# Patient Record
Sex: Female | Born: 1945 | Race: Black or African American | Hispanic: No | Smoking: Former smoker
Health system: Southern US, Community
[De-identification: ages and names within clinical notes are randomized; demographics above are authoritative.]

## PROBLEM LIST (undated history)

## (undated) DIAGNOSIS — I5042 Chronic combined systolic (congestive) and diastolic (congestive) heart failure: Secondary | ICD-10-CM

## (undated) DIAGNOSIS — M1990 Unspecified osteoarthritis, unspecified site: Secondary | ICD-10-CM

## (undated) DIAGNOSIS — D649 Anemia, unspecified: Secondary | ICD-10-CM

## (undated) DIAGNOSIS — R011 Cardiac murmur, unspecified: Secondary | ICD-10-CM

## (undated) DIAGNOSIS — R519 Headache, unspecified: Secondary | ICD-10-CM

## (undated) DIAGNOSIS — J189 Pneumonia, unspecified organism: Secondary | ICD-10-CM

## (undated) DIAGNOSIS — I447 Left bundle-branch block, unspecified: Secondary | ICD-10-CM

## (undated) DIAGNOSIS — I509 Heart failure, unspecified: Secondary | ICD-10-CM

## (undated) DIAGNOSIS — C801 Malignant (primary) neoplasm, unspecified: Secondary | ICD-10-CM

## (undated) DIAGNOSIS — G4730 Sleep apnea, unspecified: Secondary | ICD-10-CM

## (undated) DIAGNOSIS — K219 Gastro-esophageal reflux disease without esophagitis: Secondary | ICD-10-CM

## (undated) DIAGNOSIS — E662 Morbid (severe) obesity with alveolar hypoventilation: Secondary | ICD-10-CM

## (undated) DIAGNOSIS — R51 Headache: Secondary | ICD-10-CM

## (undated) DIAGNOSIS — I420 Dilated cardiomyopathy: Secondary | ICD-10-CM

## (undated) DIAGNOSIS — M109 Gout, unspecified: Secondary | ICD-10-CM

## (undated) DIAGNOSIS — R600 Localized edema: Secondary | ICD-10-CM

## (undated) DIAGNOSIS — J449 Chronic obstructive pulmonary disease, unspecified: Secondary | ICD-10-CM

## (undated) DIAGNOSIS — I10 Essential (primary) hypertension: Secondary | ICD-10-CM

## (undated) HISTORY — DX: Gastro-esophageal reflux disease without esophagitis: K21.9

## (undated) HISTORY — DX: Dilated cardiomyopathy: I42.0

## (undated) HISTORY — PX: TUBAL LIGATION: SHX77

## (undated) HISTORY — DX: Gout, unspecified: M10.9

## (undated) HISTORY — DX: Malignant (primary) neoplasm, unspecified: C80.1

## (undated) HISTORY — DX: Left bundle-branch block, unspecified: I44.7

## (undated) HISTORY — DX: Chronic combined systolic (congestive) and diastolic (congestive) heart failure: I50.42

## (undated) HISTORY — DX: Morbid (severe) obesity with alveolar hypoventilation: E66.2

## (undated) HISTORY — DX: Localized edema: R60.0

---

## 1977-07-02 HISTORY — PX: PARTIAL HYSTERECTOMY: SHX80

## 2000-06-05 ENCOUNTER — Ambulatory Visit (HOSPITAL_COMMUNITY): Admission: RE | Admit: 2000-06-05 | Discharge: 2000-06-05 | Payer: Self-pay | Admitting: *Deleted

## 2000-06-07 ENCOUNTER — Encounter: Admission: RE | Admit: 2000-06-07 | Discharge: 2000-06-07 | Payer: Self-pay | Admitting: *Deleted

## 2000-08-03 ENCOUNTER — Emergency Department (HOSPITAL_COMMUNITY): Admission: EM | Admit: 2000-08-03 | Discharge: 2000-08-03 | Payer: Self-pay | Admitting: Internal Medicine

## 2002-02-27 ENCOUNTER — Encounter: Admission: RE | Admit: 2002-02-27 | Discharge: 2002-02-27 | Payer: Self-pay | Admitting: Cardiology

## 2002-02-27 ENCOUNTER — Encounter: Payer: Self-pay | Admitting: Cardiology

## 2002-03-05 ENCOUNTER — Encounter: Payer: Self-pay | Admitting: Cardiology

## 2002-03-05 ENCOUNTER — Encounter: Admission: RE | Admit: 2002-03-05 | Discharge: 2002-03-05 | Payer: Self-pay | Admitting: Cardiology

## 2002-04-01 ENCOUNTER — Encounter: Admission: RE | Admit: 2002-04-01 | Discharge: 2002-04-01 | Payer: Self-pay | Admitting: Cardiology

## 2002-04-01 ENCOUNTER — Encounter: Payer: Self-pay | Admitting: Cardiology

## 2002-05-04 ENCOUNTER — Ambulatory Visit (HOSPITAL_BASED_OUTPATIENT_CLINIC_OR_DEPARTMENT_OTHER): Admission: RE | Admit: 2002-05-04 | Discharge: 2002-05-04 | Payer: Self-pay | Admitting: Cardiology

## 2002-05-28 ENCOUNTER — Emergency Department (HOSPITAL_COMMUNITY): Admission: EM | Admit: 2002-05-28 | Discharge: 2002-05-29 | Payer: Self-pay | Admitting: Emergency Medicine

## 2002-05-29 ENCOUNTER — Encounter: Payer: Self-pay | Admitting: Emergency Medicine

## 2002-09-05 ENCOUNTER — Encounter: Payer: Self-pay | Admitting: Emergency Medicine

## 2002-09-05 ENCOUNTER — Emergency Department (HOSPITAL_COMMUNITY): Admission: EM | Admit: 2002-09-05 | Discharge: 2002-09-05 | Payer: Self-pay | Admitting: Emergency Medicine

## 2004-07-24 ENCOUNTER — Emergency Department (HOSPITAL_COMMUNITY): Admission: EM | Admit: 2004-07-24 | Discharge: 2004-07-24 | Payer: Self-pay | Admitting: Emergency Medicine

## 2005-09-12 ENCOUNTER — Emergency Department (HOSPITAL_COMMUNITY): Admission: EM | Admit: 2005-09-12 | Discharge: 2005-09-12 | Payer: Self-pay | Admitting: Family Medicine

## 2008-01-21 ENCOUNTER — Encounter: Admission: RE | Admit: 2008-01-21 | Discharge: 2008-01-21 | Payer: Self-pay | Admitting: Nephrology

## 2008-11-16 ENCOUNTER — Encounter (HOSPITAL_BASED_OUTPATIENT_CLINIC_OR_DEPARTMENT_OTHER): Admission: RE | Admit: 2008-11-16 | Discharge: 2008-12-03 | Payer: Self-pay | Admitting: Internal Medicine

## 2009-07-18 ENCOUNTER — Encounter (HOSPITAL_BASED_OUTPATIENT_CLINIC_OR_DEPARTMENT_OTHER): Admission: RE | Admit: 2009-07-18 | Discharge: 2009-10-16 | Payer: Self-pay | Admitting: General Surgery

## 2009-08-01 ENCOUNTER — Encounter (HOSPITAL_BASED_OUTPATIENT_CLINIC_OR_DEPARTMENT_OTHER): Payer: Self-pay | Admitting: General Surgery

## 2009-08-01 ENCOUNTER — Ambulatory Visit: Payer: Self-pay | Admitting: Vascular Surgery

## 2009-08-01 ENCOUNTER — Ambulatory Visit (HOSPITAL_COMMUNITY): Admission: RE | Admit: 2009-08-01 | Discharge: 2009-08-01 | Payer: Self-pay | Admitting: General Surgery

## 2009-10-18 ENCOUNTER — Encounter (HOSPITAL_BASED_OUTPATIENT_CLINIC_OR_DEPARTMENT_OTHER): Admission: RE | Admit: 2009-10-18 | Discharge: 2010-01-16 | Payer: Self-pay | Admitting: General Surgery

## 2010-11-14 NOTE — Assessment & Plan Note (Signed)
[facility]   NAME:  Melody Flores, Melody Flores NO.:  [id number]   MEDICAL RECORD NO.:  [id number]      DATE OF BIRTH:  1945/11/30   PHYSICIAN:  Maxwell Caul, M.D. VISIT DATE:  11/18/2008                                   OFFICE VISIT   Melody Flores is a 65 year old woman, who is referred from Dr. Janey Flores  office for review an area on her left anterior leg.  She tells me that  this started about 8 months ago with a blister that formed on the leg.  It never really opened, but it became raised, hyperkeratotic, somewhat  warm and painful.  Since then, she has had similar areas developed, and  she has several of these raised, hyperkeratotic areas on the left  anterior leg.  She has a history of morbid obesity and probably some  degree of chronic lymphedema of the left greater than right leg, but she  has never had anything like this.  She does not have any other areas on  her body and has no other wound issues.   PAST MEDICAL HISTORY:  1. Peripheral vascular disease.  2. Cervical cancer status post hysterectomy.  3. Congestive heart failure.  4. Heart attack in 2007.  5. Gout.  6. Gastroesophageal reflux disease.  7. Hypertension.   MEDICATION LIST:  Reviewed.  She is on albuterol, ibuprofen 800 p.o.  p.r.n.  She takes 20 of furosemide, tramadol 500 b.i.d., omeprazole 20  daily, valsartan 320 daily, amlodipine 5 daily, ASA 81 daily, Lyrica 25  daily, and colchicine 0.6 b.i.d.   ALLERGIES:  1. PENICILLIN.  2. SULFA.   PHYSICAL EXAMINATION:  GENERAL:  She is a morbidly obese woman.  CARDIAC:  No evidence of congestive heart failure.  EXTREMITIES:  Her left leg is much more swollen than the right.  Yet  there is no general warmth, there is minimal pitting, fitting most with  probably chronic lymphedema and venous stasis.  Her pulse was palpable  at the dorsalis pedis.  We went ahead and did ABIs, which show with an  ABI of 1.36 on the right  and 1.24 on the left.  Capillary refill was  less than 3 seconds.  SKIN:  On the anterior part of her left leg, just above her ankle, is a  patch of what looked to be some degree of venous stasis; however, there  were several raised areas, which were hyperkeratotic, they were not  vascular, somewhat tender.  The largest of the measured roughly 2 x 2 cm  and was at least 0.5 cm in height.  I went ahead and did a punch biopsy  of this.  I really do not have a clear understanding of what this is.  It is not overtly infected and is not vascular, although it certainly  looks as though she has some degree of venous stasis and lymphedema.   IMPRESSIONS:  Hyperkeratotic areas on the left leg.  These are not  clearly wounds and are not blisters.  I did biopsy of largest of these,  applied trichloroacetic acid and wrapped these in a Profore to see if we  could control the  swelling and what effect this might have on this area.  We will look to  the biopsy for guidance here.  The fact that there are multiple areas  here makes me think that this was probably not a carcinoma.  Feeling  that, we would seek dermatology consultation.           ______________________________  Maxwell Caul, M.D.     MGR/MEDQ  D:  11/18/2008  T:  11/19/2008  Job:  [id number]

## 2010-11-14 NOTE — Assessment & Plan Note (Signed)
[facility]   NAME:  Melody Flores, Melody Flores NO.:  [id number]   MEDICAL RECORD NO.:  [id number]      DATE OF BIRTH:  10/11/1945   PHYSICIAN:  Maxwell Caul, M.D. VISIT DATE:  11/25/2008                                   OFFICE VISIT   HISTORY:  Melody Flores is a lady we saw last week referred from Dr.  Ardelle Balls office for review an area on her left anterior leg.  She had  had several raised hyperkeratotic areas on the anterior aspect of her  left leg which started about 8 months ago.  I actually went ahead and  biopsied this.  Unfortunately, the list of possibilities is large.  The  microscopy showed epidermal hyperplasia with slight atypia.  A  differential included verruca, Lichen simplex chronicus, seborrheic  keratosis, or precancerous keratosis such as actinic keratosis.  The  patient does not have an open wound.  Last week I put some steroid in  this and wrapped her to control her significant lymphedema of the left  leg wondering how much this had to do with this, however, she returns  today really with the leg looking not much different from last week.   PHYSICAL EXAMINATION:  She has temperature of 98.7.  she has at least 4  hyperkeratotic lesions within an area that looks somewhat like venous  stasis.  None of these is particularly tender.  However, there is no  erythema and no warmth.   IMPRESSION:  Hyperkeratotic skin lesions which have been going on for  roughly 8-9 months.  The biopsy report is nondiagnostic.  We will fax  the dermatopathology to Dr. Joseph Art and we have arranged an appointment  for her next week in dermatologic consultation.           ______________________________  Maxwell Caul, M.D.     MGR/MEDQ  D:  11/25/2008  T:  11/26/2008  Job:  [id number]

## 2011-08-16 ENCOUNTER — Emergency Department (HOSPITAL_COMMUNITY): Payer: Medicare (Managed Care)

## 2011-08-16 ENCOUNTER — Encounter (HOSPITAL_COMMUNITY): Payer: Self-pay | Admitting: Emergency Medicine

## 2011-08-16 ENCOUNTER — Emergency Department (HOSPITAL_COMMUNITY)
Admission: EM | Admit: 2011-08-16 | Discharge: 2011-08-16 | Disposition: A | Discharge status: Home / Self Care | Payer: Medicare (Managed Care) | Attending: Emergency Medicine | Admitting: Emergency Medicine

## 2011-08-16 DIAGNOSIS — A599 Trichomoniasis, unspecified: Secondary | ICD-10-CM | POA: Insufficient documentation

## 2011-08-16 DIAGNOSIS — M51379 Other intervertebral disc degeneration, lumbosacral region without mention of lumbar back pain or lower extremity pain: Secondary | ICD-10-CM | POA: Insufficient documentation

## 2011-08-16 DIAGNOSIS — M5137 Other intervertebral disc degeneration, lumbosacral region: Secondary | ICD-10-CM | POA: Insufficient documentation

## 2011-08-16 DIAGNOSIS — I10 Essential (primary) hypertension: Secondary | ICD-10-CM | POA: Insufficient documentation

## 2011-08-16 DIAGNOSIS — N281 Cyst of kidney, acquired: Secondary | ICD-10-CM

## 2011-08-16 DIAGNOSIS — R109 Unspecified abdominal pain: Secondary | ICD-10-CM | POA: Insufficient documentation

## 2011-08-16 DIAGNOSIS — Q619 Cystic kidney disease, unspecified: Secondary | ICD-10-CM | POA: Insufficient documentation

## 2011-08-16 DIAGNOSIS — N390 Urinary tract infection, site not specified: Secondary | ICD-10-CM | POA: Insufficient documentation

## 2011-08-16 HISTORY — DX: Essential (primary) hypertension: I10

## 2011-08-16 HISTORY — DX: Heart failure, unspecified: I50.9

## 2011-08-16 LAB — POCT I-STAT, CHEM 8
Calcium, Ion: 1.12 mmol/L (ref 1.12–1.32)
Chloride: 104 mEq/L (ref 96–112)
Creatinine, Ser: 1.2 mg/dL — ABNORMAL HIGH (ref 0.50–1.10)
Glucose, Bld: 110 mg/dL — ABNORMAL HIGH (ref 70–99)
HCT: 37 % (ref 36.0–46.0)
Potassium: 4 mEq/L (ref 3.5–5.1)

## 2011-08-16 LAB — URINE MICROSCOPIC-ADD ON

## 2011-08-16 LAB — URINALYSIS, ROUTINE W REFLEX MICROSCOPIC
Glucose, UA: NEGATIVE mg/dL
Protein, ur: NEGATIVE mg/dL
Specific Gravity, Urine: 1.021 (ref 1.005–1.030)
Urobilinogen, UA: 1 mg/dL (ref 0.0–1.0)

## 2011-08-16 LAB — CBC
HCT: 35.9 % — ABNORMAL LOW (ref 36.0–46.0)
Hemoglobin: 11.8 g/dL — ABNORMAL LOW (ref 12.0–15.0)
WBC: 11.1 10*3/uL — ABNORMAL HIGH (ref 4.0–10.5)

## 2011-08-16 MED ORDER — CIPROFLOXACIN HCL 500 MG PO TABS
500.0000 mg | ORAL_TABLET | Freq: Once | ORAL | Status: AC
  Administered 2011-08-16: 500 mg via ORAL
  Filled 2011-08-16: qty 1

## 2011-08-16 MED ORDER — KETOROLAC TROMETHAMINE 60 MG/2ML IM SOLN
60.0000 mg | Freq: Once | INTRAMUSCULAR | Status: AC
  Administered 2011-08-16: 60 mg via INTRAMUSCULAR
  Filled 2011-08-16: qty 2

## 2011-08-16 MED ORDER — METRONIDAZOLE 500 MG PO TABS
2000.0000 mg | ORAL_TABLET | Freq: Once | ORAL | Status: AC
  Administered 2011-08-16: 2000 mg via ORAL
  Filled 2011-08-16: qty 4

## 2011-08-16 MED ORDER — CIPROFLOXACIN HCL 500 MG PO TABS: 500.0000 mg | ORAL_TABLET | Freq: Two times a day (BID) | ORAL | Status: AC

## 2011-08-16 MED ORDER — TRAMADOL HCL 50 MG PO TABS: 50.0000 mg | ORAL_TABLET | Freq: Four times a day (QID) | ORAL | Status: AC | PRN

## 2011-08-16 NOTE — ED Provider Notes (Signed)
History     CSN: [id number]  Arrival date & time 08/16/11  0122   First MD Initiated Contact with Patient 08/16/11 0151      Chief Complaint  Patient presents with  . Flank Pain    (Consider location/radiation/quality/duration/timing/severity/associated sxs/prior treatment) Patient is a 66 y.o. female presenting with flank pain. The history is provided by the patient. No language interpreter was used.  Flank Pain This is a new problem. The current episode started more than 1 week ago (1 months ago). The problem occurs constantly. The problem has not changed since onset.Associated symptoms include abdominal pain. Pertinent negatives include no chest pain, no headaches and no shortness of breath. The symptoms are aggravated by bending, walking and twisting. The symptoms are relieved by nothing. She has tried acetaminophen for the symptoms. The treatment provided no relief.  Has had dysuria.  No f/c/r. No CP, SOB n/v/d.  One episode of diarrhea yesterday  Past Medical History  Diagnosis Date  . Gout   . Obesities, morbid   . Hypertension   . CHF (congestive heart failure)     History reviewed. No pertinent past surgical history.  No family history on file.  History  Substance Use Topics  . Smoking status: Never Smoker   . Smokeless tobacco: Not on file  . Alcohol Use: No    OB History    Grav Para Term Preterm Abortions TAB SAB Ect Mult Living                  Review of Systems  Constitutional: Negative.   HENT: Negative.   Eyes: Negative.   Respiratory: Negative for shortness of breath.   Cardiovascular: Negative for chest pain.  Gastrointestinal: Positive for abdominal pain.  Genitourinary: Positive for dysuria, urgency and flank pain.  Musculoskeletal: Negative.   Neurological: Negative for headaches.  Hematological: Negative.   Psychiatric/Behavioral: Negative.     Allergies  Penicillins and Sulfa antibiotics  Home Medications  No current outpatient  prescriptions on file.  BP 158/100  Pulse 114  Temp(Src) 97.9 F (36.6 C) (Oral)  Resp 22  Wt 330 lb (149.687 kg)  SpO2 90%  Physical Exam  Constitutional: She is oriented to person, place, and time. She appears well-developed and well-nourished. No distress.  HENT:  Head: Normocephalic and atraumatic.  Mouth/Throat: Oropharynx is clear and moist. No oropharyngeal exudate.  Eyes: Conjunctivae are normal. Pupils are equal, round, and reactive to light.  Neck: Normal range of motion. Neck supple.  Cardiovascular: Normal rate and regular rhythm.   Pulmonary/Chest: Effort normal and breath sounds normal. She has no wheezes. She has no rales.  Abdominal: Soft. Bowel sounds are normal. She exhibits no mass. There is no rebound and no guarding.  Musculoskeletal: Normal range of motion. She exhibits no edema and no tenderness.  Neurological: She is alert and oriented to person, place, and time.  Skin: Skin is warm and dry. She is not diaphoretic.  Psychiatric: She has a normal mood and affect.    ED Course  Procedures (including critical care time)   Labs Reviewed  CBC  URINALYSIS, ROUTINE W REFLEX MICROSCOPIC  URINE CULTURE   No results found.   No diagnosis found.    MDM  Please inform your doctor of renal cysts.  No sexual acitivity until 7 days after all partners treated        Melody Flores Melody Cords, MD 08/16/11 [phone]

## 2011-08-16 NOTE — Discharge Instructions (Signed)

## 2011-08-16 NOTE — ED Notes (Signed)
Pt alert, nad, c/o right flank pain, onset 1 month ago, denies recent injury, denies changes in bowel or bladder habits, denies nausea or emesis, states increases with movement

## 2011-08-16 NOTE — ED Notes (Signed)
Pt complaining of R side pain that radiates into her back, st's she only feels pain when she moves.  Pain started around 1 month ago.  No n/v/d, no SOB, no other complaints.  Bowel sound normoactive in all quadrants, breath sounds clear and equal bilaterally.

## 2011-08-17 LAB — URINE CULTURE: Colony Count: >=100000

## 2012-01-25 ENCOUNTER — Emergency Department (HOSPITAL_COMMUNITY)
Admission: EM | Admit: 2012-01-25 | Discharge: 2012-01-25 | Disposition: A | Discharge status: Home / Self Care | Payer: Medicare Other | Attending: Emergency Medicine | Admitting: Emergency Medicine

## 2012-01-25 ENCOUNTER — Encounter (HOSPITAL_COMMUNITY): Payer: Self-pay | Admitting: Emergency Medicine

## 2012-01-25 ENCOUNTER — Emergency Department (HOSPITAL_COMMUNITY): Payer: Medicare Other

## 2012-01-25 DIAGNOSIS — J449 Chronic obstructive pulmonary disease, unspecified: Secondary | ICD-10-CM | POA: Insufficient documentation

## 2012-01-25 DIAGNOSIS — I10 Essential (primary) hypertension: Secondary | ICD-10-CM | POA: Insufficient documentation

## 2012-01-25 DIAGNOSIS — M109 Gout, unspecified: Secondary | ICD-10-CM | POA: Insufficient documentation

## 2012-01-25 DIAGNOSIS — J4489 Other specified chronic obstructive pulmonary disease: Secondary | ICD-10-CM | POA: Insufficient documentation

## 2012-01-25 DIAGNOSIS — T5991XA Toxic effect of unspecified gases, fumes and vapors, accidental (unintentional), initial encounter: Secondary | ICD-10-CM

## 2012-01-25 DIAGNOSIS — Z7982 Long term (current) use of aspirin: Secondary | ICD-10-CM | POA: Insufficient documentation

## 2012-01-25 DIAGNOSIS — Z79899 Other long term (current) drug therapy: Secondary | ICD-10-CM | POA: Insufficient documentation

## 2012-01-25 HISTORY — DX: Pneumonia, unspecified organism: J18.9

## 2012-01-25 HISTORY — DX: Chronic obstructive pulmonary disease, unspecified: J44.9

## 2012-01-25 MED ORDER — IPRATROPIUM BROMIDE 0.02 % IN SOLN
0.5000 mg | Freq: Once | RESPIRATORY_TRACT | Status: AC
  Administered 2012-01-25: 0.5 mg via RESPIRATORY_TRACT
  Filled 2012-01-25: qty 2.5

## 2012-01-25 MED ORDER — ALBUTEROL SULFATE (5 MG/ML) 0.5% IN NEBU
2.5000 mg | INHALATION_SOLUTION | Freq: Once | RESPIRATORY_TRACT | Status: AC
  Administered 2012-01-25: 2.5 mg via RESPIRATORY_TRACT
  Filled 2012-01-25: qty 0.5

## 2012-01-25 NOTE — ED Provider Notes (Signed)
History     CSN: [id number]  Arrival date & time 01/25/12  1911   First MD Initiated Contact with Patient 01/25/12 2000      Chief Complaint  Patient presents with  . Toxic Inhalation    (Consider location/radiation/quality/duration/timing/severity/associated sxs/prior treatment) HPI Comments: Patient states, that her elderly mother was cleaning "bed bugs."  In her room early this morning.  Patient noticed the smell of kerosene and had a burning sensation in her throat.  She immediately left the house, but has had persistent feeling of burning.  She did use her albuterol inhaler with relief.     She denies nausea, dizziness, shortness of breath, chest pain. She has not repeated the use of her inhaler isince, stating, that her nebulizer machine, is in the house, and she was afraid to overuse the inhaler.   She will not be sleeping in the house until the odor is common.  Her spouse who is with her says the windows have been opened and the attempted cleaning.   The history is provided by the patient.    Past Medical History  Diagnosis Date  . Gout   . Obesities, morbid   . Hypertension   . CHF (congestive heart failure)   . COPD (chronic obstructive pulmonary disease)   . Pneumonia     Past Surgical History  Procedure Date  . Tubal ligation   . Partial hysterectomy     Family History  Problem Relation Age of Onset  . Hypertension Other   . Diabetes Other   . Asthma Other     History  Substance Use Topics  . Smoking status: Never Smoker   . Smokeless tobacco: Not on file  . Alcohol Use: No    OB History    Grav Para Term Preterm Abortions TAB SAB Ect Mult Living                  Review of Systems  Constitutional: Negative for fever and chills.  HENT: Positive for sore throat. Negative for postnasal drip.   Respiratory: Negative for cough and shortness of breath.   Neurological: Negative for dizziness, light-headedness and headaches.    Allergies    Penicillins and Sulfa antibiotics  Home Medications   Current Outpatient Rx  Name Route Sig Dispense Refill  . ALLOPURINOL 100 MG PO TABS Oral Take 100 mg by mouth daily.    . ASPIRIN EC 81 MG PO TBEC Oral Take 81 mg by mouth daily.    . FUROSEMIDE 40 MG PO TABS Oral Take 40 mg by mouth daily.    . IBUPROFEN 800 MG PO TABS Oral Take 800 mg by mouth 2 (two) times daily.    . ADULT MULTIVITAMIN W/MINERALS CH Oral Take 1 tablet by mouth daily.    Marland Kitchen OMEPRAZOLE 20 MG PO CPDR Oral Take 20 mg by mouth daily.    Marland Kitchen POTASSIUM CHLORIDE CRYS ER 20 MEQ PO TBCR Oral Take 20 mEq by mouth 2 (two) times daily.    . TRAMADOL HCL 50 MG PO TABS Oral Take 50 mg by mouth every 6 (six) hours as needed. For pain    . VALSARTAN 160 MG PO TABS Oral Take 160 mg by mouth daily.      BP 160/59  Pulse 93  Temp 98.7 F (37.1 C) (Oral)  Resp 18  SpO2 100%  Physical Exam  Constitutional: She is oriented to person, place, and time. She appears well-nourished.  Morbidly obese  HENT:  Head: Normocephalic.  Mouth/Throat: Uvula is midline. No uvula swelling. No posterior oropharyngeal edema or posterior oropharyngeal erythema.  Eyes: Pupils are equal, round, and reactive to light.  Neck: Normal range of motion.  Cardiovascular: Normal rate.   Pulmonary/Chest: Effort normal.  Neurological: She is alert and oriented to person, place, and time.  Skin: Skin is warm. No rash noted. No erythema.    ED Course  Procedures (including critical care time)  Labs Reviewed - No data to display No results found.   No diagnosis found.    MDM   Will administer albuterol/Atrovent treatment, obtain chest x-ray, and reevaluate Feels better        Arman Filter, NP 01/25/12 2208

## 2012-01-25 NOTE — ED Notes (Signed)
Pt states her mother dropped some kerosene in the house and she inhaled some of the fumes  Pt states she has COPD  Pt states this evening she started having some burning in her throat  Pt states she has been outside all day  Pt states she had to use her inhaler earlier today and it helped some

## 2012-01-25 NOTE — ED Notes (Signed)
Pt c/o throat pain that began earlier today after she was exposed to kerosene fumes. Pt states her sister dropped a kerosene tank causing the fumes to leak. Pt has hx of COPD but states she is not currently SOB. Pt's only complaint is burning in the throat.

## 2012-01-26 NOTE — ED Provider Notes (Signed)
Medical screening examination/treatment/procedure(s) were performed by non-physician practitioner and as supervising physician I was immediately available for consultation/collaboration.  Derwood Kaplan, MD 01/26/12 1451

## 2014-07-14 ENCOUNTER — Other Ambulatory Visit (HOSPITAL_COMMUNITY): Payer: Self-pay | Admitting: Internal Medicine

## 2014-07-14 DIAGNOSIS — I509 Heart failure, unspecified: Secondary | ICD-10-CM

## 2014-07-16 ENCOUNTER — Ambulatory Visit (HOSPITAL_COMMUNITY)
Admission: RE | Admit: 2014-07-16 | Discharge: 2014-07-16 | Disposition: A | Discharge status: Home / Self Care | Payer: Commercial Managed Care - HMO | Source: Ambulatory Visit | Attending: Internal Medicine | Admitting: Internal Medicine

## 2014-07-16 DIAGNOSIS — I509 Heart failure, unspecified: Secondary | ICD-10-CM | POA: Diagnosis not present

## 2014-07-16 NOTE — Progress Notes (Signed)
  Echocardiogram 2D Echocardiogram has been performed.  Melody Flores 07/16/2014, 5:32 PM

## 2014-12-12 DIAGNOSIS — R0609 Other forms of dyspnea: Secondary | ICD-10-CM

## 2014-12-12 DIAGNOSIS — I420 Dilated cardiomyopathy: Secondary | ICD-10-CM

## 2014-12-12 NOTE — H&P (Signed)
OFFICE VISIT NOTES COPIED TO EPIC FOR DOCUMENTATION   Melody Flores 2014-12-06 11:45 AM Location: [facility] Patient #: [id number] DOB: July 27, 1945 Widowed / Language: Melody Flores / Race: Black or African American Female    History of Present Illness(Melody Flores, AGNP-C; 12-06-2014 12:27 PM) The patient is a 69 year old female who presents with congestive heart failure. Symptoms include shortness of breath, dyspnea on exertion and edema, while symptoms do not include orthopnea or paroxysmal nocturnal dyspnea. Onset was gradual. The symptoms occur constantly. The patient describes this as mild and unchanged. Associated symptoms include wheezing, while associated symptoms do not include chest pain or palpitations. Current treatment includes diuretics and angiotensin receptor blockers. By report there is good compliance with treatment, good tolerance of treatment and fair symptom control. Pertinent medical history includes hypertension and chronic obstructive pulmonary disease. Risk factors include obesity and sedentary lifestyle, while risk factors do not include smoking. The patient is able to do activities of daily living with restrictions. The patient was previously evaluated by a primary physician 5 month(s) ago. Past evaluation has included an ECG and an echocardiogram.     Problem List/Past Medical(Melody Flores; 12/06/2014 11:46 AM) CHF (congestive heart failure) (I50.9) Gouty arthritis (M10.9) Essential hypertension, benign (I10) COPD (chronic obstructive pulmonary disease) (J44.9) GERD (gastroesophageal reflux disease) (K21.9)    Allergies(Melody Flores; 06-Dec-2014 11:49 AM) Penicillins. Swelling. Sulfa Drugs. Swelling.    Family History(Melody Flores; December 06, 2014 11:51 AM) Father. Deceased. at age 13 from natural causes. Mother. Deceased. at age 56 from pneumonia; hx of htn Sister 27. 3 Deceased 1 Living (younger) Brother 3. 2 Deceased 1  Living (younger)    Social History(Melody Flores; Dec 06, 2014 11:52 AM) Number of Children. 67. 3 sons- all alive and well and 2 daughters alive and well and 1 daughter deceased at age 77 due to colon cancer. Current tobacco use. Never smoker. Non Drinker/No Alcohol Use Marital status. Widowed. Living Situation. Lives with relatives. Grandaughter lives with her    Past Surgical History(Melody Flores; 12/06/14 11:52 AM) Hysterectomy; Abdominal. 1979 Due to Cancer    Medication History(Melody Flores; 2014-12-06 11:57 AM) Medications Reconciled. Omeprazole (20MG  Tablet DR, 1 Oral daily) Active. Potassium Chloride (20MEQ Tablet ER, 1 Oral daily) Active. Valsartan (320MG  Tablet, 1 Oral daily) Active. Aspirin (81MG  Tablet, 1 Oral daily) Active. Furosemide (40MG  Tablet, 1 Oral daily) Active. Allopurinol (100MG  Tablet, 1 Oral daily) Active. Diclofenac Sodium (75MG  Tablet, 1 Oral two times daily as needed) Active. TraMADol HCl (50MG  Tablet, 1 Oral two times daily as needed) Active. Multiple Vitamin (1 (one) Oral daily) Active. Proventil HFA (108 (90 Base)MCG/ACT Aerosol Soln, 2 puffs Inhalation as needed) Active. Albuterol Sulfate ((5 MG/ML)0.5% Nebulized Soln, inhale Inhalation three times daily) Active.    Diagnostic Studies History(Melody Flores, AGNP-C; Dec 06, 2014 4:38 PM) Echocardiogram. 07/16/2014 Left ventricle: The cavity size was normal. Wall thickness was normal. The estimated ejection fraction was in the range of 20% to 25%. Mitral valve: There was mild regurgitation. Left atrium: The atrium was moderately dilated Sleep Study. 2006 Positive for Sleep Apnea, Pt uses CPAP Nuclear stress test. 2008    Review of Systems(Melody Flores, AGNP-C; 12-06-14 12:27 PM) General:Not Present- Anorexia, Fatigue and Fever. Respiratory:Present- Difficulty Breathing on Exertionand Wheezing. Not Present- Cough and Decreased Exercise Tolerance. Cardiovascular:Not  Present- Chest Pain, Claudications, Edema, Orthopnea, Paroxysmal Nocturnal Dyspnea and Shortness of Breath. Gastrointestinal:Not Present- Change in Bowel Habits, Constipation and Nausea. Neurological:Not Present- Focal Neurological Symptoms. Endocrine:Not Present- Appetite Changes, Cold Intolerance and Heat Intolerance. Hematology:Not Present- Anemia, Petechiae  and Prolonged Bleeding.    Vitals(Melody Flores, AGNP-C; 12/01/2014 12:19 PM) 12/01/2014 12:19 PM BP: 190/110 (Sitting, Left Arm, Standard)  12/01/2014 11:46 AM Weight: 357.56 lb Height: 66 in Body Surface Area: 2.75 m Body Mass Index: 57.71 kg/m Pulse: 107 (Regular) P.OX: 94% (Room air) BP: 198/138 (Sitting, Left Arm, Standard)     Physical Exam(Melody Flores, AGNP-C; 12/01/2014 12:28 PM) The physical exam findings are as follows:   General Mental Status- Alert. General Appearance- Cooperative and Appears stated age. Not in acute distress. Orientation- Oriented X3. Build & Nutrition- Moderately built and Morbidly obese.   Head and Neck Thyroid Gland Characteristics- no palpable nodules. no palpable enlargement.   Chest and Lung Exam Palpation:Tender- No chest wall tenderness. Auscultation: Breath sounds:- Clear.   Cardiovascular Inspection:Jugular vein- Right- No Distention. Auscultation:Heart Sounds- S1 WNL, S2 WNL and No gallop present. Murmurs & Other Heart Sounds: Murmur:- No murmur.   Abdomen Inspection:Contour- Obese. Palpation/Percussion:Palpation and Percussion of the abdomen reveal - Non Tender and No hepatosplenomegaly. Auscultation:Auscultation of the abdomen reveals - Bowel sounds normal.   Peripheral Vascular Lower Extremity:Inspection- Left- No Pigmentation or Varicose veins. Right- No Pigmentation or Varicose veins. Palpation:Edema- Left- No edema. Right- No edema. Femoral pulse- Left- Normal. Right- Normal. Popliteal pulse-  Left- Normal. Right- Normal. Dorsalis pedis pulse- Left- Normal. Right- Normal. Posterior tibial pulse- Left- Normal. Right- Normal. Carotid arteries- Left- No Carotid bruit. Right- No Carotid bruit. Abdomen- No prominent abdominal aortic pulsation or epigastric bruit.   Neurologic Motor:- Grossly intact without any focal deficits.   Musculoskeletal Global Assessment Left Lower Extremity - normal range of motion without pain. Right Lower Extremity- normal range of motion without pain.    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 No further analysis due to LBBB Current Plans l Discontinued Valsartan 320MG  (change to [place]). l Started Entresto 49-51MG , 1 (one) Tablet two times daily, #60, 12/01/2014, No Refill. l Started Coreg 3.125MG , 1 (one) Tablet two times daily, #60, 12/01/2014, Ref. x3.  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 I discussed primary/secondary prevention and also dietary counseling was done. She presents for evaluation of cardiomyopathy. Echocardiogram on 07/16/2014 reveals LVEF 20-25%. She reports dyspnea with exertion, although she is extremely sedentary and immobile due to morbid obesity with a BMI of 57. Schedule for left and right heart catheterization, and possible angioplasty, to evaluate for ischemic cardiomyopathy. We discussed regarding risks, benefits, alternatives to this including stress testing, CTA and continued medical  therapy. Patient wants to proceed. Understands <1-2% risk of death, stroke, MI, urgent CABG, bleeding, infection, renal failure but not limited to these. Change Valsartan to Entresto and begin Coreg. Discussed extensively with the patient and her granddaughter at the bedside the prognosis of morbid obesity and uncontrolled hypertension. Both were encouraged to make signficant lifestyle changes to lose weight. Follow up after cath.  *I have discussed this case with Dr. Einar Gip and he personally examined the patient and participated in formulating the plan.* 12/01/2014  Future Plans l 12/06/2014: CBC & PLATELETS (AUTO) (54270) - one time l 12/01/3760: METABOLIC PANEL, COMPREHENSIVE (83151) - one time l 12/06/2014:  PT (PROTHROMBIN TIME) (22025) - one time   I have personally reviewed the patient's record and performed physical exam and agree with the assessment and plan of Ms. Neldon Labella, NP-C.  Adrian Prows, MD 12/12/2014, 7:16 PM [facility] [place] Pager: [phone] Office: [phone] If no answer: Cell:  [phone]

## 2014-12-14 ENCOUNTER — Ambulatory Visit (HOSPITAL_COMMUNITY)
Admission: RE | Admit: 2014-12-14 | Discharge: 2014-12-14 | Disposition: A | Discharge status: Home / Self Care | Payer: Medicare HMO | Source: Ambulatory Visit | Attending: Cardiology | Admitting: Cardiology

## 2014-12-14 ENCOUNTER — Encounter (HOSPITAL_COMMUNITY)
Admission: RE | Disposition: A | Discharge status: Home / Self Care | Payer: Self-pay | Source: Ambulatory Visit | Attending: Cardiology

## 2014-12-14 DIAGNOSIS — E662 Morbid (severe) obesity with alveolar hypoventilation: Secondary | ICD-10-CM | POA: Diagnosis not present

## 2014-12-14 DIAGNOSIS — E785 Hyperlipidemia, unspecified: Secondary | ICD-10-CM | POA: Insufficient documentation

## 2014-12-14 DIAGNOSIS — I420 Dilated cardiomyopathy: Secondary | ICD-10-CM | POA: Diagnosis not present

## 2014-12-14 DIAGNOSIS — I10 Essential (primary) hypertension: Secondary | ICD-10-CM | POA: Diagnosis not present

## 2014-12-14 DIAGNOSIS — I447 Left bundle-branch block, unspecified: Secondary | ICD-10-CM | POA: Insufficient documentation

## 2014-12-14 DIAGNOSIS — K219 Gastro-esophageal reflux disease without esophagitis: Secondary | ICD-10-CM | POA: Insufficient documentation

## 2014-12-14 DIAGNOSIS — Z6841 Body Mass Index (BMI) 40.0 and over, adult: Secondary | ICD-10-CM | POA: Insufficient documentation

## 2014-12-14 DIAGNOSIS — I272 Other secondary pulmonary hypertension: Secondary | ICD-10-CM | POA: Insufficient documentation

## 2014-12-14 DIAGNOSIS — I2510 Atherosclerotic heart disease of native coronary artery without angina pectoris: Secondary | ICD-10-CM | POA: Diagnosis not present

## 2014-12-14 DIAGNOSIS — J449 Chronic obstructive pulmonary disease, unspecified: Secondary | ICD-10-CM | POA: Insufficient documentation

## 2014-12-14 DIAGNOSIS — Z7982 Long term (current) use of aspirin: Secondary | ICD-10-CM | POA: Diagnosis not present

## 2014-12-14 DIAGNOSIS — M1000 Idiopathic gout, unspecified site: Secondary | ICD-10-CM | POA: Diagnosis not present

## 2014-12-14 DIAGNOSIS — I5042 Chronic combined systolic (congestive) and diastolic (congestive) heart failure: Secondary | ICD-10-CM | POA: Insufficient documentation

## 2014-12-14 DIAGNOSIS — R0609 Other forms of dyspnea: Secondary | ICD-10-CM

## 2014-12-14 HISTORY — PX: CARDIAC CATHETERIZATION: SHX172

## 2014-12-14 LAB — POCT I-STAT 3, VENOUS BLOOD GAS (G3P V)
Acid-base deficit: 1 mmol/L (ref 0.0–2.0)
Bicarbonate: 26 mEq/L — ABNORMAL HIGH (ref 20.0–24.0)
O2 SAT: 72 %
PCO2 VEN: 49.5 mmHg (ref 45.0–50.0)
PO2 VEN: 41 mmHg (ref 30.0–45.0)
TCO2: 27 mmol/L (ref 0–100)
pH, Ven: 7.328 — ABNORMAL HIGH (ref 7.250–7.300)

## 2014-12-14 LAB — POCT I-STAT 3, ART BLOOD GAS (G3+)
Acid-base deficit: 1 mmol/L (ref 0.0–2.0)
Bicarbonate: 25.8 mEq/L — ABNORMAL HIGH (ref 20.0–24.0)
O2 Saturation: 97 %
PCO2 ART: 48.6 mmHg — AB (ref 35.0–45.0)
PO2 ART: 95 mmHg (ref 80.0–100.0)
TCO2: 27 mmol/L (ref 0–100)
pH, Arterial: 7.333 — ABNORMAL LOW (ref 7.350–7.450)

## 2014-12-14 SURGERY — RIGHT/LEFT HEART CATH AND CORONARY ANGIOGRAPHY
Anesthesia: LOCAL

## 2014-12-14 MED ORDER — SODIUM CHLORIDE 0.9 % IJ SOLN: 3.0000 mL | Freq: Two times a day (BID) | INTRAMUSCULAR | Status: DC

## 2014-12-14 MED ORDER — SODIUM CHLORIDE 0.9 % IV SOLN: 250.0000 mL | INTRAVENOUS | Status: DC | PRN

## 2014-12-14 MED ORDER — IOHEXOL 350 MG/ML SOLN
INTRAVENOUS | Status: DC | PRN
  Administered 2014-12-14: 60 mL via INTRACARDIAC

## 2014-12-14 MED ORDER — FENTANYL CITRATE (PF) 100 MCG/2ML IJ SOLN
INTRAMUSCULAR | Status: AC
  Filled 2014-12-14: qty 2

## 2014-12-14 MED ORDER — ASPIRIN 81 MG PO CHEW: 81.0000 mg | CHEWABLE_TABLET | ORAL | Status: DC

## 2014-12-14 MED ORDER — HEPARIN (PORCINE) IN NACL 2-0.9 UNIT/ML-% IJ SOLN
INTRAMUSCULAR | Status: AC
  Filled 2014-12-14: qty 1000

## 2014-12-14 MED ORDER — FENTANYL CITRATE (PF) 100 MCG/2ML IJ SOLN
INTRAMUSCULAR | Status: DC | PRN
  Administered 2014-12-14: 25 ug via INTRAVENOUS

## 2014-12-14 MED ORDER — SODIUM CHLORIDE 0.9 % IV SOLN: INTRAVENOUS | Status: AC

## 2014-12-14 MED ORDER — SODIUM CHLORIDE 0.9 % IJ SOLN: 3.0000 mL | INTRAMUSCULAR | Status: DC | PRN

## 2014-12-14 MED ORDER — MIDAZOLAM HCL 2 MG/2ML IJ SOLN
INTRAMUSCULAR | Status: AC
  Filled 2014-12-14: qty 2

## 2014-12-14 MED ORDER — SODIUM CHLORIDE 0.9 % IV SOLN: INTRAVENOUS | Status: DC

## 2014-12-14 MED ORDER — LIDOCAINE HCL (PF) 1 % IJ SOLN
INTRAMUSCULAR | Status: AC
  Filled 2014-12-14: qty 30

## 2014-12-14 MED ORDER — MIDAZOLAM HCL 2 MG/2ML IJ SOLN
INTRAMUSCULAR | Status: DC | PRN
  Administered 2014-12-14: 2 mg via INTRAVENOUS

## 2014-12-14 SURGICAL SUPPLY — 11 items
CATH INFINITI 5FR MPB2 (CATHETERS) ×2 IMPLANT
CATH SWAN GANZ 7F STRAIGHT (CATHETERS) ×2 IMPLANT
KIT HEART LEFT (KITS) ×2 IMPLANT
KIT HEART RIGHT NAMIC (KITS) ×2 IMPLANT
NEEDLE PERC 18GX7CM (NEEDLE) ×2 IMPLANT
PACK CARDIAC CATHETERIZATION (CUSTOM PROCEDURE TRAY) ×2 IMPLANT
SET INTRODUCER MICROPUNCT 5F (INTRODUCER) ×2 IMPLANT
SHEATH PINNACLE 5F 10CM (SHEATH) ×2 IMPLANT
SHEATH PINNACLE 7F 10CM (SHEATH) ×2 IMPLANT
TRANSDUCER W/STOPCOCK (MISCELLANEOUS) ×2 IMPLANT
WIRE EMERALD 3MM-J .035X150CM (WIRE) ×2 IMPLANT

## 2014-12-14 NOTE — Interval H&P Note (Signed)
History and Physical Interval Note:  12/14/2014 1:19 PM  Melody Flores  has presented today for surgery, with the diagnosis of chf  The various methods of treatment have been discussed with the patient and family. After consideration of risks, benefits and other options for treatment, the patient has consented to  Procedure(s): Right/Left Heart Cath and Coronary Angiography (N/A) as a surgical intervention .  The patient's history has been reviewed, patient examined, no change in status, stable for surgery.  I have reviewed the patient's chart and labs.  Questions were answered to the patient's satisfaction.   Procedure being observed by Ms. Elita Boone PA student and patient consents to this.   Adrian Prows

## 2014-12-14 NOTE — Discharge Instructions (Signed)
Angiogram, Care After Refer to this sheet in the next few weeks. These instructions provide you with information on caring for yourself after your procedure. Your health care provider may also give you more specific instructions. Your treatment has been planned according to current medical practices, but problems sometimes occur. Call your health care provider if you have any problems or questions after your procedure.  WHAT TO EXPECT AFTER THE PROCEDURE After your procedure, it is typical to have the following sensations:  Minor discomfort or tenderness and a small bump at the catheter insertion site. The bump should usually decrease in size and tenderness within 1 to 2 weeks.  Any bruising will usually fade within 2 to 4 weeks. HOME CARE INSTRUCTIONS   You may need to keep taking blood thinners if they were prescribed for you. Take medicines only as directed by your health care provider.  Do not apply powder or lotion to the site.  Do not take baths, swim, or use a hot tub until your health care provider approves.  You may shower 24 hours after the procedure. Remove the bandage (dressing) and gently wash the site with plain soap and water. Gently pat the site dry.  Inspect the site at least twice daily.  Limit your activity for the first 48 hours. Do not bend, squat, or lift anything over 20 lb (9 kg) or as directed by your health care provider.  Plan to have someone take you home after the procedure. Follow instructions about when you can drive or return to work. SEEK MEDICAL CARE IF:  You get light-headed when standing up.  You have drainage (other than a small amount of blood on the dressing).  You have chills.  You have a fever.  You have redness, warmth, swelling, or pain at the insertion site. SEEK IMMEDIATE MEDICAL CARE IF:   You develop chest pain or shortness of breath, feel faint, or pass out.  You have bleeding, swelling larger than a walnut, or drainage from the  catheter insertion site.  You develop pain, discoloration, coldness, or severe bruising in the leg or arm that held the catheter.  You develop bleeding from any other place, such as the bowels. You may see bright red blood in your urine or stools, or your stools may appear black and tarry.  You have heavy bleeding from the site. If this happens, hold pressure on the site. CALL 911 MAKE SURE YOU:  Understand these instructions.  Will watch your condition.  Will get help right away if you are not doing well or get worse. Document Released: 01/04/2005 Document Revised: 11/02/2013 Document Reviewed: 11/10/2012 [employer] Patient Information 2015 [place], [place]. This information is not intended to replace advice given to you by your health care provider. Make sure you discuss any questions you have with your health care provider.  PT WAS GIVEN CHF PACKET/ SODIUM REDUCTION AND DAILY WEIGHTS WERE REVIEWED. PT TO CALL OFFICE WITH WEIGHT GAIN/ SOB

## 2014-12-14 NOTE — Progress Notes (Signed)
Site area: rt groin Site Prior to Removal:  Level  0 Pressure Applied For:  20 minutes Manual:   yes Patient Status During Pull:  stable Post Pull Site:  Level 0 Post Pull Instructions Given:  yes Post Pull Pulses Present: yes Dressing Applied:  tegaderm Bedrest begins @  1440 Comments:

## 2014-12-15 ENCOUNTER — Encounter (HOSPITAL_COMMUNITY): Payer: Self-pay | Admitting: Cardiology

## 2014-12-15 MED FILL — Lidocaine HCl Local Preservative Free (PF) Inj 1%: INTRAMUSCULAR | Qty: 30 | Status: AC

## 2014-12-15 MED FILL — Heparin Sodium (Porcine) 2 Unit/ML in Sodium Chloride 0.9%: INTRAMUSCULAR | Qty: 1000 | Status: AC

## 2015-01-18 ENCOUNTER — Encounter (HOSPITAL_COMMUNITY): Payer: Self-pay | Admitting: Emergency Medicine

## 2015-03-22 ENCOUNTER — Encounter: Payer: Self-pay | Admitting: *Deleted

## 2015-03-28 ENCOUNTER — Institutional Professional Consult (permissible substitution): Payer: Self-pay | Admitting: Cardiology

## 2015-03-29 ENCOUNTER — Encounter: Payer: Self-pay | Admitting: Cardiology

## 2015-04-06 ENCOUNTER — Institutional Professional Consult (permissible substitution): Payer: Self-pay | Admitting: Internal Medicine

## 2015-04-13 ENCOUNTER — Encounter: Payer: Self-pay | Admitting: Internal Medicine

## 2015-04-20 ENCOUNTER — Encounter: Payer: Self-pay | Admitting: Internal Medicine

## 2015-05-03 ENCOUNTER — Encounter: Payer: Self-pay | Admitting: Cardiology

## 2015-05-03 NOTE — Progress Notes (Signed)
Opened in error  This encounter was created in error - please disregard. 

## 2015-05-04 ENCOUNTER — Encounter: Payer: Self-pay | Admitting: Cardiology

## 2015-05-04 ENCOUNTER — Encounter: Payer: Self-pay | Admitting: Internal Medicine

## 2015-08-10 ENCOUNTER — Encounter (HOSPITAL_COMMUNITY): Payer: Self-pay | Admitting: *Deleted

## 2015-08-10 NOTE — Progress Notes (Addendum)
Pt has history of CHF. Followed by Dr. Einar Gip. Pt denies chest pain or sob. Pt does have sleep apnea and uses Cpap  Echo -07/16/14 - in EPIC EKG - 12/01/14 - in EPIC Cath - 12/14/14 - in EPIC

## 2015-08-12 ENCOUNTER — Ambulatory Visit (HOSPITAL_COMMUNITY)
Admission: RE | Admit: 2015-08-12 | Discharge: 2015-08-12 | Disposition: A | Discharge status: Home / Self Care | Payer: Medicare HMO | Source: Ambulatory Visit | Attending: Gastroenterology | Admitting: Gastroenterology

## 2015-08-12 ENCOUNTER — Encounter (HOSPITAL_COMMUNITY): Payer: Self-pay | Admitting: Critical Care Medicine

## 2015-08-12 ENCOUNTER — Encounter (HOSPITAL_COMMUNITY)
Admission: RE | Disposition: A | Discharge status: Home / Self Care | Payer: Self-pay | Source: Ambulatory Visit | Attending: Gastroenterology

## 2015-08-12 ENCOUNTER — Ambulatory Visit (HOSPITAL_COMMUNITY): Payer: Medicare HMO | Admitting: Critical Care Medicine

## 2015-08-12 DIAGNOSIS — I10 Essential (primary) hypertension: Secondary | ICD-10-CM | POA: Diagnosis not present

## 2015-08-12 DIAGNOSIS — Z1211 Encounter for screening for malignant neoplasm of colon: Secondary | ICD-10-CM | POA: Insufficient documentation

## 2015-08-12 DIAGNOSIS — Z800 Family history of malignant neoplasm of digestive organs: Secondary | ICD-10-CM | POA: Diagnosis not present

## 2015-08-12 DIAGNOSIS — G4730 Sleep apnea, unspecified: Secondary | ICD-10-CM | POA: Diagnosis not present

## 2015-08-12 DIAGNOSIS — R0609 Other forms of dyspnea: Principal | ICD-10-CM

## 2015-08-12 DIAGNOSIS — I420 Dilated cardiomyopathy: Secondary | ICD-10-CM

## 2015-08-12 DIAGNOSIS — Z87891 Personal history of nicotine dependence: Secondary | ICD-10-CM | POA: Diagnosis not present

## 2015-08-12 DIAGNOSIS — I509 Heart failure, unspecified: Secondary | ICD-10-CM | POA: Insufficient documentation

## 2015-08-12 HISTORY — DX: Cardiac murmur, unspecified: R01.1

## 2015-08-12 HISTORY — DX: Headache: R51

## 2015-08-12 HISTORY — DX: Unspecified osteoarthritis, unspecified site: M19.90

## 2015-08-12 HISTORY — DX: Headache, unspecified: R51.9

## 2015-08-12 HISTORY — PX: COLONOSCOPY WITH PROPOFOL: SHX5780

## 2015-08-12 HISTORY — DX: Anemia, unspecified: D64.9

## 2015-08-12 HISTORY — DX: Sleep apnea, unspecified: G47.30

## 2015-08-12 SURGERY — COLONOSCOPY WITH PROPOFOL
Anesthesia: Monitor Anesthesia Care

## 2015-08-12 SURGERY — COLONOSCOPY WITH PROPOFOL
Anesthesia: General

## 2015-08-12 MED ORDER — LACTATED RINGERS IV SOLN
INTRAVENOUS | Status: DC
  Administered 2015-08-12: 10:00:00 via INTRAVENOUS

## 2015-08-12 MED ORDER — SODIUM CHLORIDE 0.9 % IV SOLN: INTRAVENOUS | Status: DC

## 2015-08-12 MED ORDER — PROPOFOL 500 MG/50ML IV EMUL
INTRAVENOUS | Status: DC | PRN
  Administered 2015-08-12: 10:00:00 via INTRAVENOUS
  Administered 2015-08-12: 150 ug/kg/min via INTRAVENOUS

## 2015-08-12 MED ORDER — PROPOFOL 10 MG/ML IV BOLUS
INTRAVENOUS | Status: DC | PRN
  Administered 2015-08-12 (×4): 10 mg via INTRAVENOUS

## 2015-08-12 MED ORDER — SACUBITRIL-VALSARTAN 24-26 MG PO TABS
1.0000 | ORAL_TABLET | Freq: Two times a day (BID) | ORAL | Status: AC
  Administered 2015-08-12: 1 via ORAL
  Filled 2015-08-12: qty 1

## 2015-08-12 MED ORDER — CARVEDILOL 12.5 MG PO TABS
12.5000 mg | ORAL_TABLET | Freq: Two times a day (BID) | ORAL | Status: AC
  Administered 2015-08-12: 12.5 mg via ORAL
  Filled 2015-08-12: qty 1

## 2015-08-12 NOTE — Op Note (Signed)
[facility] [place], [address]   COLONOSCOPY PROCEDURE REPORT     EXAM DATE: 2015-09-10  PATIENT NAME:      Melody Flores, Melody Flores           MR #: [id number] BIRTHDATE:       March 25, 1946      VISIT #:     [id number]  ATTENDING:     Acquanetta Sit, MD     STATUS:     outpatient ASSISTANT:      William Dalton and Alinda Deem  INDICATIONS:  The patient is a 70 yr old female here for a colonoscopy due to screening/family history of colon cancer  PROCEDURE PERFORMED:     colonoscopy to transverse colon  MEDICATIONS:     propofol per anesthesia ESTIMATED BLOOD LOSS:     None  CONSENT: The patient understands the risks and benefits of the procedure and understands that these risks include, but are not limited to: sedation, allergic reaction, infection, perforation and/or bleeding. Alternative means of evaluation and treatment include, among others: physical exam, x-rays, and/or surgical intervention. The patient elects to proceed with this endoscopic procedure.  DESCRIPTION OF PROCEDURE: During intra-op preparation period all mechanical & medical equipment was checked for proper function. Hand hygiene and appropriate measures for infection prevention was taken. After the risks, benefits and alternatives of the procedure were thoroughly explained, Informed consent was verified, confirmed and timeout was successfully executed by the treatment team. A digital exam was normal      The Pentax Adult Colon [id number] endoscope was introduced through the anus and advanced to the mid transverse colon  . Despite all maneuvers the scope could not be advanced beyond the transverse colon region.   . prep quality was good      The instrument was then slowly withdrawn as the colon was fully examined.Estimated blood loss is zero unless otherwise noted in this procedure report. The scope was then completely withdrawn from the patient and the procedure  terminated. SCOPE WITHDRAWAL TIME:  Findings the scope was advanced to the transverse colon. There was a lot of looping. Despite pressure and changing position the scope could not be advanced further. The exam was normal as far as the mucosa was concerned.      ADVERSE EVENTS:      There were no immediate complications.  IMPRESSIONS:     normal colonoscopy to the transverse colon region.  RECOMMENDATIONS:     obtain barium enema to evaluate the rest of the colon. RECALL:  _____________________________ Acquanetta Sit, MD eSigned:  Acquanetta Sit, MD 09/10/2015 10:41 AM   cc:   CPT CODES: ICD CODES:  The ICD and CPT codes recommended by this software are interpretations from the data that the clinical staff has captured with the software.  The verification of the translation of this report to the ICD and CPT codes and modifiers is the sole responsibility of the health care institution and practicing physician where this report was generated.  [employer] will not be held responsible for the validity of the ICD and CPT codes included on this report.  AMA assumes no liability for data contained or not contained herein. CPT is a Designer, television/film set of the [employer].

## 2015-08-12 NOTE — Anesthesia Preprocedure Evaluation (Signed)
Anesthesia Evaluation  Patient identified by MRN, date of birth, ID band Patient awake    Reviewed: Allergy & Precautions, NPO status , Patient's Chart, lab work & pertinent test results  Airway Mallampati: III  TM Distance: >3 FB     Dental  (+) Partial Upper, Dental Advisory Given   Pulmonary former smoker,    breath sounds clear to auscultation       Cardiovascular hypertension,  Rhythm:Regular     Neuro/Psych    GI/Hepatic   Endo/Other    Renal/GU      Musculoskeletal   Abdominal (+) + obese,   Peds  Hematology   Anesthesia Other Findings   Reproductive/Obstetrics                             Anesthesia Physical Anesthesia Plan  ASA: III  Anesthesia Plan: General   Post-op Pain Management:    Induction: Intravenous  Airway Management Planned: Natural Airway and Simple Face Mask  Additional Equipment:   Intra-op Plan:   Post-operative Plan:   Informed Consent: I have reviewed the patients History and Physical, chart, labs and discussed the procedure including the risks, benefits and alternatives for the proposed anesthesia with the patient or authorized representative who has indicated his/her understanding and acceptance.   Dental advisory given  Plan Discussed with:   Anesthesia Plan Comments:         Anesthesia Quick Evaluation

## 2015-08-12 NOTE — H&P (Signed)
The patient is a 70 year old female who presents to the endoscopy unit at the hospital for outpatient screening colonoscopy. She is asymptomatic from a GI standpoint. There is a family history of colon cancer in her daughter.  Physical  No acute distress  Nonicteric  Heart regular rhythm no murmurs  Lungs clear  Abdomen: Bowel sounds normal, soft, nontender  Impression: Family history of colon cancer  Plan: Colonoscopy

## 2015-08-12 NOTE — Anesthesia Procedure Notes (Signed)
Procedure Name: MAC Date/Time: 08/12/2015 10:06 AM Performed by: Merrilyn Puma B Pre-anesthesia Checklist: Patient identified, Emergency Drugs available, Suction available, Patient being monitored and Timeout performed Patient Re-evaluated:Patient Re-evaluated prior to inductionOxygen Delivery Method: Simple face mask Intubation Type: IV induction Placement Confirmation: positive ETCO2 and breath sounds checked- equal and bilateral Dental Injury: Teeth and Oropharynx as per pre-operative assessment

## 2015-08-12 NOTE — Transfer of Care (Signed)
Immediate Anesthesia Transfer of Care Note  Patient: Melody Flores  Procedure(s) Performed: Procedure(s): COLONOSCOPY WITH PROPOFOL (N/A)  Patient Location: Endoscopy Unit  Anesthesia Type:MAC  Level of Consciousness: sedated  Airway & Oxygen Therapy: Patient Spontanous Breathing and Patient connected to face mask oxygen  Post-op Assessment: Report given to RN, Post -op Vital signs reviewed and stable and Patient moving all extremities X 4  Post vital signs: Reviewed and stable  Last Vitals:  Filed Vitals:   08/12/15 0824  BP: 216/125  Pulse: 86  Resp: 15    Complications: No apparent anesthesia complications

## 2015-08-12 NOTE — Discharge Instructions (Signed)
Colonoscopy, Care After These instructions give you information on caring for yourself after your procedure. Your doctor may also give you more specific instructions. Call your doctor if you have any problems or questions after your procedure. HOME CARE  Do not drive for 24 hours.  Do not sign important papers or use machinery for 24 hours.  You may shower.  You may go back to your usual activities, but go slower for the first 24 hours.  Take rest breaks often during the first 24 hours.  Walk around or use warm packs on your belly (abdomen) if you have belly cramping or gas.  Drink enough fluids to keep your pee (urine) clear or pale yellow.  Resume your normal diet. Avoid heavy or fried foods.  Avoid drinking alcohol for 24 hours or as told by your doctor.  Only take medicines as told by your doctor. If a tissue sample (biopsy) was taken during the procedure:   Do not take aspirin or blood thinners for 7 days, or as told by your doctor.  Do not drink alcohol for 7 days, or as told by your doctor.  Eat soft foods for the first 24 hours. GET HELP IF: You still have a small amount of blood in your poop (stool) 2-3 days after the procedure. GET HELP RIGHT AWAY IF:  You have more than a small amount of blood in your poop.  You see clumps of tissue (blood clots) in your poop.  Your belly is puffy (swollen).  You feel sick to your stomach (nauseous) or throw up (vomit).  You have a fever.  You have belly pain that gets worse and medicine does not help. MAKE SURE YOU:  Understand these instructions.  Will watch your condition.  Will get help right away if you are not doing well or get worse.   This information is not intended to replace advice given to you by your health care provider. Make sure you discuss any questions you have with your health care provider.   Document Released: 07/21/2010 Document Revised: 06/23/2013 Document Reviewed: 02/23/2013 Elsevier  Interactive Patient Education 2016 [employer].    Barium Enema  A barium enema is a test that looks at the inside of your large intestine (bowel) using X-rays. Your bowel must be empty to do the test. If your bowel is not empty, this test cannot be done. Your clinic may give you a prep kit or tell you where to get one. If you make the appointment for your test, tell the person if:  You have dry, hard poops (constipation).  You have diabetes.  You have kidney (renal) disease.  You have a opening in your belly (abdomen) where food passes (colostomy). BEFORE THE TEST The day before the test:   Eat a low-fiber breakfast such as hot wheat cereal, boiled or scrambled eggs, white bread, jelly, bacon, plain tea, black coffee, and butter.  Do not eat sugar or dairy products.  Drink 1 large glass of water each hour from 1:00 PM until 9:00 PM, or as told by your doctor.  For lunch and dinner, only drink clear liquids. Do not eat any solid foods. You may have:  Clear soup.  Unsweetened fruit juice.  Sugar-free gelatin.  Black coffee.  Plain tea.  Do not eat any milk, cheese, cream, or sugar.  At 5:00 PM, drink the whole bottle of magnesium citrate. This is a strong medicine that helps you go poop (laxative). You will have many poops (bowel  movements).  At 8:00 PM, swallow the 3 bisacodyl tablets with water. Do not chew the tablets. This also helps you go poop. It is very important for you to follow the instructions about what to eat and what not to eat. Drink all of the liquid prep medicine for your test, so that the poop in your bowel will be cleaned out.  THE MORNING OF THE TEST  Get up at least 2 hours before the time of your test. Do not eat or drink anything.  Put the suppository into your bottom (rectum).  You will poop (bowel movement) in 20 to 30 minutes.  If you take insulin for your diabetes, do not take your insulin the morning of your test. Bring your insulin with  you. You may take it after your test is over.   Go to the radiology department. Bring your ID and your medical record number. TEST  You lie on a table and an X-ray of your belly is taken to make sure that your bowel is completely empty. If empty, you will turn on your side and a tube will be put into your bottom.  After the tube is in, a barium liquid will flow gently through the tube into your bowel. You may have cramping or feel like you need to poop. Hold the liquid barium in until you are told to go to the bathroom.  You will be asked to lie in different positions as the X-rays are being taken. You may go to the bathroom after the X-rays have been taken. Finding out the results of your test Ask when your test results will be ready. Make sure you get your test results.   This information is not intended to replace advice given to you by your health care provider. Make sure you discuss any questions you have with your health care provider.   Document Released: 04/15/2009 Document Revised: 09/10/2011 Document Reviewed: 12/31/2014 Elsevier Interactive Patient Education [employer].

## 2015-08-12 NOTE — OR Nursing (Signed)
Back from radiology. Dressing to be discharged home

## 2015-08-13 NOTE — Anesthesia Postprocedure Evaluation (Signed)
Anesthesia Post Note  Patient: Melody Flores  Procedure(s) Performed: Procedure(s) (LRB): COLONOSCOPY WITH PROPOFOL (N/A)  Patient location during evaluation: Endoscopy Anesthesia Type: MAC Level of consciousness: awake and awake and alert Pain management: pain level controlled Vital Signs Assessment: post-procedure vital signs reviewed and stable Respiratory status: spontaneous breathing and nonlabored ventilation Anesthetic complications: no    Last Vitals:  Filed Vitals:   08/12/15 1145 08/12/15 1150  BP: 176/98 186/95  Pulse: 76 76  Temp:    Resp: 18 22    Last Pain: There were no vitals filed for this visit.               Raziah Funnell COKER

## 2015-08-15 ENCOUNTER — Encounter (HOSPITAL_COMMUNITY): Payer: Self-pay | Admitting: Gastroenterology

## 2016-03-30 ENCOUNTER — Institutional Professional Consult (permissible substitution): Payer: Self-pay | Admitting: Pulmonary Disease

## 2016-04-04 ENCOUNTER — Ambulatory Visit (INDEPENDENT_AMBULATORY_CARE_PROVIDER_SITE_OTHER): Payer: Medicare HMO | Admitting: Physician Assistant

## 2016-04-04 DIAGNOSIS — M1711 Unilateral primary osteoarthritis, right knee: Secondary | ICD-10-CM

## 2016-04-04 DIAGNOSIS — M1712 Unilateral primary osteoarthritis, left knee: Secondary | ICD-10-CM | POA: Diagnosis not present

## 2016-05-08 ENCOUNTER — Institutional Professional Consult (permissible substitution): Payer: Self-pay | Admitting: Pulmonary Disease

## 2016-05-16 ENCOUNTER — Encounter (INDEPENDENT_AMBULATORY_CARE_PROVIDER_SITE_OTHER): Payer: Self-pay | Admitting: Physician Assistant

## 2016-05-16 ENCOUNTER — Ambulatory Visit (INDEPENDENT_AMBULATORY_CARE_PROVIDER_SITE_OTHER): Payer: Medicare HMO | Admitting: Physician Assistant

## 2016-05-16 DIAGNOSIS — M1711 Unilateral primary osteoarthritis, right knee: Secondary | ICD-10-CM | POA: Diagnosis not present

## 2016-05-16 DIAGNOSIS — M1712 Unilateral primary osteoarthritis, left knee: Secondary | ICD-10-CM | POA: Diagnosis not present

## 2016-05-16 DIAGNOSIS — M170 Bilateral primary osteoarthritis of knee: Secondary | ICD-10-CM

## 2016-05-16 MED ORDER — LIDOCAINE HCL 1 % IJ SOLN
3.0000 mL | INTRAMUSCULAR | Status: AC | PRN
  Administered 2016-05-16: 3 mL

## 2016-05-16 MED ORDER — METHYLPREDNISOLONE ACETATE 40 MG/ML IJ SUSP
40.0000 mg | INTRAMUSCULAR | Status: AC | PRN
  Administered 2016-05-16: 40 mg via INTRA_ARTICULAR

## 2016-05-16 NOTE — Progress Notes (Signed)
Office Visit Note   Patient: Melody Flores           Date of Birth: [birth year]           MRN: [id number] Visit Date: 05/16/2016              Requested by: Nolene Ebbs, MD [address] [place], [place] [address] PCP: Philis Fendt, MD   Assessment & Plan: Visit Diagnoses:  1. Primary osteoarthritis of both knees     Plan: She'll continue work on weight loss. We will see her on as necessary basis, she understands she can only have cortisone injections every 3 months in the knees.  Follow-Up Instructions: Return if symptoms worsen or fail to improve.   Orders:  Orders Placed This Encounter  Procedures  . Large Joint Injection/Arthrocentesis  . Large Joint Injection/Arthrocentesis   No orders of the defined types were placed in this encounter.     Procedures: Large Joint Inj Date/Time: 05/16/2016 4:11 PM Performed by: Pete Pelt Authorized by: Pete Pelt   Consent Given by:  Patient Indications:  Pain Location:  Knee Site:  L knee Needle Size:  22 G Approach:  Anterolateral Ultrasound Guidance: No   Fluoroscopic Guidance: No   Medications:  40 mg methylPREDNISolone acetate 40 MG/ML; 3 mL lidocaine 1 % Aspiration Attempted: No   Patient tolerance:  Patient tolerated the procedure well with no immediate complications Large Joint Inj Date/Time: 05/16/2016 4:12 PM Performed by: Pete Pelt Authorized by: Pete Pelt   Consent Given by:  Patient Indications:  Pain Location:  Knee Site:  R knee Needle Size:  22 G Approach:  Anterolateral Ultrasound Guidance: No   Fluoroscopic Guidance: No   Medications:  40 mg methylPREDNISolone acetate 40 MG/ML; 3 mL lidocaine 1 % Aspiration Attempted: No   Patient tolerance:  Patient tolerated the procedure well with no immediate complications     Clinical Data: No additional findings.   Subjective: Chief Complaint  Patient presents with  . Right Knee - Pain  . Left Knee - Pain     Patient comes in today for follow up on bil knees. Patient had Monovisc injections 04/04/16. Injections help some. Ambulates with cane. She has aching pains. Hurts to weight bear. Doing home exercises on her own. States that the Monovisc injections really did not help. She is wonderingwhat else she can in regards to her knee pain. She is unable to take NSAIDs due to her kidneys    Review of Systems   Objective: Vital Signs: There were no vitals taken for this visit.  Physical Exam  Constitutional: She is oriented to person, place, and time. She appears well-developed and well-nourished.  Neurological: She is alert and oriented to person, place, and time.    Right Knee Exam   Range of Motion  Flexion: 90    Left Knee Exam   Range of Motion  Flexion: 90     Varus deformity of bilateral knees. No abnormal warmth erythema and ecchymosis of either knee.  Specialty Comments:  No specialty comments available.  Imaging: No results found.   PMFS History: Patient Active Problem List   Diagnosis Date Noted  . Dyspnea on exertion 12/12/2014  . Congestive dilated cardiomyopathy ([place]) 12/12/2014   Past Medical History:  Diagnosis Date  . Acute gouty arthritis   . Anemia   . Arthritis   . Bilateral edema of lower extremity   . Cancer ([place])    cervical  cancer, had partial hysterectomy  . CHF (congestive heart failure) ([place])   . Chronic combined systolic and diastolic heart failure ([place])   . COPD (chronic obstructive pulmonary disease) ([place])   . Dilated cardiomyopathy ([place])   . Gastroesophageal reflux disease   . Gout   . Headache    sinus headaches  . Heart murmur    when she was younger, never had any problems  . Hypertension   . LBBB (left bundle branch block)   . Morbid obesity with alveolar hypoventilation ([place])   . Pneumonia   . Sleep apnea    uses cpap    Family History  Problem Relation Age of Onset  . Hypertension Mother   . Diabetes Other   .  Asthma Other   . Colon cancer Daughter   . Healthy Son     X3  . Healthy Daughter     x2  . Pneumonia Mother   . Healthy Father     Past Surgical History:  Procedure Laterality Date  . CARDIAC CATHETERIZATION N/A 12/14/2014   Procedure: Right/Left Heart Cath and Coronary Angiography;  Surgeon: Adrian Prows, MD;  Location: [facility];  Service: Cardiovascular;  Laterality: N/A;  . COLONOSCOPY WITH PROPOFOL N/A 08/12/2015   Procedure: COLONOSCOPY WITH PROPOFOL;  Surgeon: Wonda Horner, MD;  Location: [facility] ENDOSCOPY;  Service: Endoscopy;  Laterality: N/A;  . PARTIAL HYSTERECTOMY  1979   abdominal, cancer  . TUBAL LIGATION     Social History   Occupational History  . Not on file.   Social History Main Topics  . Smoking status: Former Smoker    Quit date: 05/17/1999  . Smokeless tobacco: Never Used     Comment: patient states she smoked before, quit in the 2000s  . Alcohol use No  . Drug use: No  . Sexual activity: Not on file

## 2016-05-22 ENCOUNTER — Telehealth (INDEPENDENT_AMBULATORY_CARE_PROVIDER_SITE_OTHER): Payer: Self-pay | Admitting: Physician Assistant

## 2016-07-11 ENCOUNTER — Institutional Professional Consult (permissible substitution): Payer: Self-pay | Admitting: Pulmonary Disease

## 2016-09-03 ENCOUNTER — Institutional Professional Consult (permissible substitution): Payer: Self-pay | Admitting: Pulmonary Disease

## 2016-09-19 ENCOUNTER — Ambulatory Visit (INDEPENDENT_AMBULATORY_CARE_PROVIDER_SITE_OTHER): Admitting: Physician Assistant

## 2016-09-26 ENCOUNTER — Encounter (INDEPENDENT_AMBULATORY_CARE_PROVIDER_SITE_OTHER): Payer: Self-pay | Admitting: Physician Assistant

## 2016-09-26 ENCOUNTER — Ambulatory Visit (INDEPENDENT_AMBULATORY_CARE_PROVIDER_SITE_OTHER): Payer: Medicare PPO | Admitting: Physician Assistant

## 2016-09-26 DIAGNOSIS — M1711 Unilateral primary osteoarthritis, right knee: Secondary | ICD-10-CM | POA: Diagnosis not present

## 2016-09-26 DIAGNOSIS — M1712 Unilateral primary osteoarthritis, left knee: Secondary | ICD-10-CM

## 2016-09-26 DIAGNOSIS — M170 Bilateral primary osteoarthritis of knee: Secondary | ICD-10-CM

## 2016-09-26 MED ORDER — LIDOCAINE HCL 1 % IJ SOLN
3.0000 mL | INTRAMUSCULAR | Status: AC | PRN
  Administered 2016-09-26: 3 mL

## 2016-09-26 MED ORDER — METHYLPREDNISOLONE ACETATE 40 MG/ML IJ SUSP
40.0000 mg | INTRAMUSCULAR | Status: AC | PRN
  Administered 2016-09-26: 40 mg via INTRA_ARTICULAR

## 2016-09-26 NOTE — Progress Notes (Signed)
Office Visit Note   Patient: Melody Flores           Date of Birth: Feb 19, 1946           MRN: [id number] Visit Date: 09/26/2016              Requested by: Nolene Ebbs, MD [address] [place], [place] [address] PCP: Philis Fendt, MD   Assessment & Plan: Visit Diagnoses:  1. Bilateral primary osteoarthritis of knee     Plan: Follow-up with [place] once her Monovisc injections are available for both knees.  Follow-Up Instructions: Return for Supplemental injection.   Orders:  No orders of the defined types were placed in this encounter.  No orders of the defined types were placed in this encounter.     Procedures: Large Joint Inj Date/Time: 09/26/2016 5:18 PM Performed by: Pete Pelt Authorized by: Pete Pelt   Consent Given by:  Patient Indications:  Pain Location:  Knee Site:  L knee Needle Size:  22 G Approach:  Anterolateral Ultrasound Guidance: No   Fluoroscopic Guidance: No   Medications:  40 mg methylPREDNISolone acetate 40 MG/ML; 3 mL lidocaine 1 % Aspiration Attempted: No   Patient tolerance:  Patient tolerated the procedure well with no immediate complications Large Joint Inj Date/Time: 09/26/2016 5:18 PM Performed by: Pete Pelt Authorized by: Pete Pelt   Consent Given by:  Patient Indications:  Pain Location:  Knee Site:  R knee Needle Size:  22 G Approach:  Anterolateral Ultrasound Guidance: No   Fluoroscopic Guidance: No   Medications:  40 mg methylPREDNISolone acetate 40 MG/ML; 3 mL lidocaine 1 % Aspiration Attempted: No   Patient tolerance:  Patient tolerated the procedure well with no immediate complications     Clinical Data: No additional findings.   Subjective: Chief Complaint  Patient presents with  . Right Knee - Pain  . Left Knee - Pain    [name] is here for bilateral knee injections. She had monovisc injections done 04/04/2016 and she said they did ok for her.  She can't have these  done again till 10/03/2016 and she is aware of this.  Today she aware the we can do the steroid injections and she is ok with this since she is hurting. I advised that we would have to get the monovisc authorized for her to get if Artis Delay wants them to be done.    Review of Systems   Objective: Vital Signs: There were no vitals taken for this visit.  Physical Exam  Ortho Exam Bilateral knees full extension flexion beyond 90 no abnormal warmth noted erythema no effusion of either knee. Specialty Comments:  No specialty comments available.  Imaging: No results found.   PMFS History: Patient Active Problem List   Diagnosis Date Noted  . Dyspnea on exertion 12/12/2014  . Congestive dilated cardiomyopathy ([place]) 12/12/2014   Past Medical History:  Diagnosis Date  . Acute gouty arthritis   . Anemia   . Arthritis   . Bilateral edema of lower extremity   . Cancer ([place])    cervical cancer, had partial hysterectomy  . CHF (congestive heart failure) ([place])   . Chronic combined systolic and diastolic heart failure ([place])   . COPD (chronic obstructive pulmonary disease) ([place])   . Dilated cardiomyopathy ([place])   . Gastroesophageal reflux disease   . Gout   . Headache    sinus headaches  . Heart murmur    when she was younger,  never had any problems  . Hypertension   . LBBB (left bundle branch block)   . Morbid obesity with alveolar hypoventilation ([place])   . Pneumonia   . Sleep apnea    uses cpap    Family History  Problem Relation Age of Onset  . Hypertension Mother   . Diabetes Other   . Asthma Other   . Colon cancer Daughter   . Healthy Son     X3  . Healthy Daughter     x2  . Pneumonia Mother   . Healthy Father     Past Surgical History:  Procedure Laterality Date  . CARDIAC CATHETERIZATION N/A 12/14/2014   Procedure: Right/Left Heart Cath and Coronary Angiography;  Surgeon: Adrian Prows, MD;  Location: [facility];  Service: Cardiovascular;  Laterality: N/A;  .  COLONOSCOPY WITH PROPOFOL N/A 08/12/2015   Procedure: COLONOSCOPY WITH PROPOFOL;  Surgeon: Wonda Horner, MD;  Location: [facility] ENDOSCOPY;  Service: Endoscopy;  Laterality: N/A;  . PARTIAL HYSTERECTOMY  1979   abdominal, cancer  . TUBAL LIGATION     Social History   Occupational History  . Not on file.   Social History Main Topics  . Smoking status: Former Smoker    Quit date: 05/17/1999  . Smokeless tobacco: Never Used     Comment: patient states she smoked before, quit in the 2000s  . Alcohol use No  . Drug use: No  . Sexual activity: Not on file

## 2016-11-19 ENCOUNTER — Ambulatory Visit (INDEPENDENT_AMBULATORY_CARE_PROVIDER_SITE_OTHER): Admitting: Physician Assistant

## 2016-11-29 ENCOUNTER — Ambulatory Visit (INDEPENDENT_AMBULATORY_CARE_PROVIDER_SITE_OTHER): Admitting: Physician Assistant

## 2016-11-29 ENCOUNTER — Ambulatory Visit (INDEPENDENT_AMBULATORY_CARE_PROVIDER_SITE_OTHER): Payer: Medicare PPO | Admitting: Orthopaedic Surgery

## 2016-11-29 DIAGNOSIS — M1711 Unilateral primary osteoarthritis, right knee: Secondary | ICD-10-CM | POA: Insufficient documentation

## 2016-11-29 DIAGNOSIS — M25562 Pain in left knee: Secondary | ICD-10-CM

## 2016-11-29 DIAGNOSIS — G8929 Other chronic pain: Secondary | ICD-10-CM

## 2016-11-29 DIAGNOSIS — M1712 Unilateral primary osteoarthritis, left knee: Secondary | ICD-10-CM | POA: Insufficient documentation

## 2016-11-29 DIAGNOSIS — M25561 Pain in right knee: Secondary | ICD-10-CM

## 2016-11-29 NOTE — Progress Notes (Signed)
The patient is a very pleasant 71 year old with bilateral knee pain and arthritis of both her knees. She is morbidly obese. She is working on Publishing rights manager. She's here for scheduled hyaluronic acid injections in both knees. She is doing well otherwise.  On examination of her knee she has severe obesity both her knees. Both knees have slight varus malalignment with good range of motion.  Placed hyaluronic acid which was Monvisc in both knees which he tolerated well. We'll see her back in 2 months to consider steroid injections again in both knees.

## 2017-01-28 ENCOUNTER — Ambulatory Visit (INDEPENDENT_AMBULATORY_CARE_PROVIDER_SITE_OTHER): Payer: Medicare PPO | Admitting: Orthopaedic Surgery

## 2017-01-30 ENCOUNTER — Ambulatory Visit (INDEPENDENT_AMBULATORY_CARE_PROVIDER_SITE_OTHER): Payer: Medicare PPO | Admitting: Orthopaedic Surgery

## 2017-01-30 DIAGNOSIS — M1711 Unilateral primary osteoarthritis, right knee: Secondary | ICD-10-CM | POA: Diagnosis not present

## 2017-01-30 DIAGNOSIS — M1712 Unilateral primary osteoarthritis, left knee: Secondary | ICD-10-CM | POA: Diagnosis not present

## 2017-01-30 MED ORDER — METHYLPREDNISOLONE ACETATE 40 MG/ML IJ SUSP
40.0000 mg | INTRAMUSCULAR | Status: AC | PRN
  Administered 2017-01-30: 40 mg via INTRA_ARTICULAR

## 2017-01-30 MED ORDER — LIDOCAINE HCL 1 % IJ SOLN
3.0000 mL | INTRAMUSCULAR | Status: AC | PRN
  Administered 2017-01-30: 3 mL

## 2017-01-30 NOTE — Progress Notes (Signed)
Office Visit Note   Patient: Melody Flores           Date of Birth: 1945/12/15           MRN: [id number] Visit Date: 01/30/2017              Requested by: Nolene Ebbs, MD [place], [place] [address] PCP: Nolene Ebbs, MD   Assessment & Plan: Visit Diagnoses:  1. Unilateral primary osteoarthritis, left knee   2. Unilateral primary osteoarthritis, right knee     Plan: I agree with trying steroid injections in her knees again. I'm apprehensive about knee replacement surgery given her obesity. I would like to see her back though in 3 months and I'll like to AP and lateral of both knees at that visit.  Follow-Up Instructions: Return in about 3 months (around 05/02/2017).   Orders:  No orders of the defined types were placed in this encounter.  No orders of the defined types were placed in this encounter.     Procedures: Large Joint Inj Date/Time: 01/30/2017 2:18 PM Performed by: Mcarthur Rossetti Authorized by: Jean Rosenthal Y   Location:  Knee Site:  R knee Ultrasound Guidance: No   Fluoroscopic Guidance: No   Arthrogram: No   Medications:  3 mL lidocaine 1 %; 40 mg methylPREDNISolone acetate 40 MG/ML Large Joint Inj Date/Time: 01/30/2017 2:18 PM Performed by: Mcarthur Rossetti Authorized by: Mcarthur Rossetti   Location:  Knee Site:  L knee Ultrasound Guidance: No   Fluoroscopic Guidance: No   Arthrogram: No   Medications:  3 mL lidocaine 1 %; 40 mg methylPREDNISolone acetate 40 MG/ML     Clinical Data: No additional findings.   Subjective: No chief complaint on file. Patient is a morbidly obese 71 year old with bilateral knee arthritis arthritis. She is 8 weeks out from bilateral hyaluronic acid injections in her knees. She said they did not help much. She would like to have steroid injections in her knees today. She is not a diabetic.  HPI  Review of Systems Denies any headache, chest pain, fever, chills,  nausea, vomiting.  Objective: Vital Signs: There were no vitals taken for this visit.  Physical Exam She is alert or 3 in no acute distress Ortho Exam Both knees have varus malalignment. Both knees are significant only obese with good range of motion with definite crepitation. Specialty Comments:  No specialty comments available.  Imaging: No results found.   PMFS History: Patient Active Problem List   Diagnosis Date Noted  . Chronic pain of left knee 11/29/2016  . Chronic pain of right knee 11/29/2016  . Unilateral primary osteoarthritis, left knee 11/29/2016  . Unilateral primary osteoarthritis, right knee 11/29/2016  . Dyspnea on exertion 12/12/2014  . Congestive dilated cardiomyopathy ([place]) 12/12/2014   Past Medical History:  Diagnosis Date  . Acute gouty arthritis   . Anemia   . Arthritis   . Bilateral edema of lower extremity   . Cancer ([place])    cervical cancer, had partial hysterectomy  . CHF (congestive heart failure) ([place])   . Chronic combined systolic and diastolic heart failure ([place])   . COPD (chronic obstructive pulmonary disease) ([place])   . Dilated cardiomyopathy ([place])   . Gastroesophageal reflux disease   . Gout   . Headache    sinus headaches  . Heart murmur    when she was younger, never had any problems  . Hypertension   . LBBB (left bundle branch block)   .  Morbid obesity with alveolar hypoventilation ([place])   . Pneumonia   . Sleep apnea    uses cpap    Family History  Problem Relation Age of Onset  . Hypertension Mother   . Diabetes Other   . Asthma Other   . Colon cancer Daughter   . Healthy Son        X3  . Healthy Daughter        x2  . Pneumonia Mother   . Healthy Father     Past Surgical History:  Procedure Laterality Date  . CARDIAC CATHETERIZATION N/A 12/14/2014   Procedure: Right/Left Heart Cath and Coronary Angiography;  Surgeon: Adrian Prows, MD;  Location: [facility];  Service: Cardiovascular;  Laterality: N/A;  .  COLONOSCOPY WITH PROPOFOL N/A 08/12/2015   Procedure: COLONOSCOPY WITH PROPOFOL;  Surgeon: Wonda Horner, MD;  Location: [facility] ENDOSCOPY;  Service: Endoscopy;  Laterality: N/A;  . PARTIAL HYSTERECTOMY  1979   abdominal, cancer  . TUBAL LIGATION     Social History   Occupational History  . Not on file.   Social History Main Topics  . Smoking status: Former Smoker    Quit date: 05/17/1999  . Smokeless tobacco: Never Used     Comment: patient states she smoked before, quit in the 2000s  . Alcohol use No  . Drug use: No  . Sexual activity: Not on file

## 2017-02-04 ENCOUNTER — Telehealth: Payer: Self-pay | Admitting: Pulmonary Disease

## 2017-02-04 NOTE — Telephone Encounter (Signed)
Left a message for patient to call back. Per RA, he wanted to see the patient tomorrow 02/05/17.

## 2017-02-05 NOTE — Telephone Encounter (Signed)
Per RA, it is ok to close this message since she has no-showed twice and has cancelled twice.

## 2017-05-13 ENCOUNTER — Ambulatory Visit (INDEPENDENT_AMBULATORY_CARE_PROVIDER_SITE_OTHER): Payer: Medicare PPO

## 2017-05-13 ENCOUNTER — Encounter (INDEPENDENT_AMBULATORY_CARE_PROVIDER_SITE_OTHER): Payer: Self-pay | Admitting: Orthopaedic Surgery

## 2017-05-13 ENCOUNTER — Ambulatory Visit (INDEPENDENT_AMBULATORY_CARE_PROVIDER_SITE_OTHER): Payer: Medicare PPO | Admitting: Orthopaedic Surgery

## 2017-05-13 DIAGNOSIS — M1711 Unilateral primary osteoarthritis, right knee: Secondary | ICD-10-CM

## 2017-05-13 DIAGNOSIS — M25561 Pain in right knee: Secondary | ICD-10-CM

## 2017-05-13 DIAGNOSIS — G8929 Other chronic pain: Secondary | ICD-10-CM | POA: Diagnosis not present

## 2017-05-13 DIAGNOSIS — M25562 Pain in left knee: Secondary | ICD-10-CM

## 2017-05-13 DIAGNOSIS — M1712 Unilateral primary osteoarthritis, left knee: Secondary | ICD-10-CM

## 2017-05-13 MED ORDER — TRAMADOL HCL 50 MG PO TABS: 50.0000 mg | ORAL_TABLET | Freq: Two times a day (BID) | ORAL | 0 refills | Status: DC | PRN

## 2017-05-13 NOTE — Progress Notes (Signed)
Office Visit Note   Patient: Melody Flores           Date of Birth: 06-Jul-1945           MRN: [id number] Visit Date: 05/13/2017              Requested by: Nolene Ebbs, MD [place], [place] [address] PCP: Nolene Ebbs, MD   Assessment & Plan: Visit Diagnoses:  1. Chronic pain of both knees   2. Unilateral primary osteoarthritis, left knee   3. Unilateral primary osteoarthritis, right knee     Plan: Due to the patient's severe arthritis though I think that I can try a steroid injections in her knees.  She is so significantly morbidly obese there would not be safe to to perform knee replacement surgery on her due to her significant body habitus and the large soft tissue envelope around the knees.  I recommended a bariatric program for her for weight loss.  I am certainly fine with trying steroid injections in her knees from time to time and is been over 3 months since her last injection so she is definitely amenable to this.  I injected both knees without difficulty and explained the risk and benefits of injections to her.  We can always reinject her in 4 months from now.  All questions and concerns were answered and addressed.  Follow-Up Instructions: Return in about 4 months (around 09/10/2017).   Orders:  Orders Placed This Encounter  Procedures  . XR Knee 1-2 Views Left  . XR Knee 1-2 Views Right   Meds ordered this encounter  Medications  . traMADol (ULTRAM) 50 MG tablet    Sig: Take 1-2 tablets (50-100 mg total) every 12 (twelve) hours as needed by mouth.    Dispense:  60 tablet    Refill:  0      Procedures: No procedures performed   Clinical Data: No additional findings.   Subjective: Chief Complaint  Patient presents with  . Right Knee - Pain  . Left Knee - Pain  The patient is well-known to the office.  She has severe osteoarthritis and degenerative joint disease in both her knees.  She is someone who is morbidly obese with a body mass  index well over 50.  She has had severe acute pain in the knees where she cannot walk at this point she states.  She has had steroid injections in her knees just over 3 months ago.  She uses a walker and cane when she can.  She is not a diabetic.  HPI  Review of Systems She currently denies any headache, chest pain, shortness of breath, fever, chills, nausea, vomiting  Objective: Vital Signs: There were no vitals taken for this visit.  Physical Exam She is alert and oriented x3 in no acute distress Ortho Exam Examination of both knees show a large soft tissue envelope around both knees.  Both knees have painful range of motion with patellofemoral crepitation. Specialty Comments:  No specialty comments available.  Imaging: Xr Knee 1-2 Views Left  Result Date: 05/13/2017 An AP and lateral of the left knee show severe end-stage arthritis with complete loss of the entire joint space and varus malalignment.  There is a large soft tissue envelope due to severe obesity.  Xr Knee 1-2 Views Right  Result Date: 05/13/2017 2 views of the right knee shows complete loss of joint space with varus malalignment.  There is severe osteoarthritis in the knee.  There  is a large soft tissue envelope consistent with her obesity.    PMFS History: Patient Active Problem List   Diagnosis Date Noted  . Chronic pain of left knee 11/29/2016  . Chronic pain of both knees 11/29/2016  . Unilateral primary osteoarthritis, left knee 11/29/2016  . Unilateral primary osteoarthritis, right knee 11/29/2016  . Dyspnea on exertion 12/12/2014  . Congestive dilated cardiomyopathy ([place]) 12/12/2014   Past Medical History:  Diagnosis Date  . Acute gouty arthritis   . Anemia   . Arthritis   . Bilateral edema of lower extremity   . Cancer ([place])    cervical cancer, had partial hysterectomy  . CHF (congestive heart failure) ([place])   . Chronic combined systolic and diastolic heart failure ([place])   . COPD (chronic  obstructive pulmonary disease) ([place])   . Dilated cardiomyopathy ([place])   . Gastroesophageal reflux disease   . Gout   . Headache    sinus headaches  . Heart murmur    when she was younger, never had any problems  . Hypertension   . LBBB (left bundle branch block)   . Morbid obesity with alveolar hypoventilation ([place])   . Pneumonia   . Sleep apnea    uses cpap    Family History  Problem Relation Age of Onset  . Hypertension Mother   . Diabetes Other   . Asthma Other   . Colon cancer Daughter   . Healthy Son        X3  . Healthy Daughter        x2  . Pneumonia Mother   . Healthy Father     Past Surgical History:  Procedure Laterality Date  . PARTIAL HYSTERECTOMY  1979   abdominal, cancer  . TUBAL LIGATION     Social History   Occupational History  . Not on file  Tobacco Use  . Smoking status: Former Smoker    Last attempt to quit: 05/17/1999    Years since quitting: 18.0  . Smokeless tobacco: Never Used  . Tobacco comment: patient states she smoked before, quit in the 2000s  Substance and Sexual Activity  . Alcohol use: No    Alcohol/week: 0.0 oz  . Drug use: No  . Sexual activity: Not on file

## 2017-09-09 ENCOUNTER — Ambulatory Visit (INDEPENDENT_AMBULATORY_CARE_PROVIDER_SITE_OTHER): Payer: Medicare PPO | Admitting: Orthopaedic Surgery

## 2017-09-09 ENCOUNTER — Encounter (INDEPENDENT_AMBULATORY_CARE_PROVIDER_SITE_OTHER): Payer: Self-pay | Admitting: Orthopaedic Surgery

## 2017-09-09 VITALS — Wt 325.0 lb

## 2017-09-09 DIAGNOSIS — M1711 Unilateral primary osteoarthritis, right knee: Secondary | ICD-10-CM

## 2017-09-09 DIAGNOSIS — M1712 Unilateral primary osteoarthritis, left knee: Secondary | ICD-10-CM

## 2017-09-09 MED ORDER — ACETAMINOPHEN-CODEINE #3 300-30 MG PO TABS: 1.0000 | ORAL_TABLET | Freq: Three times a day (TID) | ORAL | 0 refills | Status: DC | PRN

## 2017-09-09 NOTE — Progress Notes (Signed)
Office Visit Note   Patient: Melody Flores           Date of Birth: Oct 10, 1945           MRN: [id number] Visit Date: 09/09/2017              Requested by: Nolene Ebbs, MD [address] [place], [place] [address] PCP: Nolene Ebbs, MD   Assessment & Plan: Visit Diagnoses:  1. Unilateral primary osteoarthritis, right knee   2. Unilateral primary osteoarthritis, left knee     Plan: Again I encouraged her about weight loss and this would be the only treatment for her that could help with her knee pain.  Also a steroid injections are definitely not unreasonable when they are spaced out.  Again she is not a surgical candidate due to this weight.  I provide a steroid injection in her knees without any difficulty.  She knows to wait at least 4 months for the next injections.  Follow-Up Instructions: Return in about 4 months (around 01/09/2018).   Orders:  No orders of the defined types were placed in this encounter.  Meds ordered this encounter  Medications  . acetaminophen-codeine (TYLENOL #3) 300-30 MG tablet    Sig: Take 1-2 tablets by mouth every 8 (eight) hours as needed for moderate pain.    Dispense:  60 tablet    Refill:  0      Procedures: No procedures performed   Clinical Data: No additional findings.   Subjective: Chief Complaint  Patient presents with  . Right Knee - Pain  . Left Knee - Pain  Patient is morbidly obese 72 year old female well-known to me.  She has bilateral knee severe osteoarthritis and joint disease.  Her soft tissue envelope is such around both her thighs and knees that I could not safely perform knee replacement surgery on her.  Her BMI is over 40.  She is tried steroid injections in her knees and it does help and she knows to wait at least 4 months between injections.  She like to have steroid injections today.  There is been no other acute changes in her medical status.  HPI  Review of Systems She currently denies any  headache, chest pain, shortness of breath, fever, chills, nausea, vomiting.  She is alert and oriented x3 and in no acute distress  Objective: Vital Signs: Wt (!) 325 lb (147.4 kg)   BMI 41.73 kg/m   Physical Exam She is alert and oriented x3 in no acute distress Ortho Exam Examination of both her knees show morbid obesity around the knees global tenderness and patellofemoral crepitation.  Specialty Comments:  No specialty comments available.  Imaging: No results found.   PMFS History: Patient Active Problem List   Diagnosis Date Noted  . Chronic pain of left knee 11/29/2016  . Chronic pain of both knees 11/29/2016  . Unilateral primary osteoarthritis, left knee 11/29/2016  . Unilateral primary osteoarthritis, right knee 11/29/2016  . Dyspnea on exertion 12/12/2014  . Congestive dilated cardiomyopathy ([place]) 12/12/2014   Past Medical History:  Diagnosis Date  . Acute gouty arthritis   . Anemia   . Arthritis   . Bilateral edema of lower extremity   . Cancer ([place])    cervical cancer, had partial hysterectomy  . CHF (congestive heart failure) ([place])   . Chronic combined systolic and diastolic heart failure ([place])   . COPD (chronic obstructive pulmonary disease) ([place])   . Dilated cardiomyopathy ([place])   . Gastroesophageal  reflux disease   . Gout   . Headache    sinus headaches  . Heart murmur    when she was younger, never had any problems  . Hypertension   . LBBB (left bundle branch block)   . Morbid obesity with alveolar hypoventilation ([place])   . Pneumonia   . Sleep apnea    uses cpap    Family History  Problem Relation Age of Onset  . Hypertension Mother   . Diabetes Other   . Asthma Other   . Colon cancer Daughter   . Healthy Son        X3  . Healthy Daughter        x2  . Pneumonia Mother   . Healthy Father     Past Surgical History:  Procedure Laterality Date  . CARDIAC CATHETERIZATION N/A 12/14/2014   Procedure: Right/Left Heart Cath and Coronary  Angiography;  Surgeon: Adrian Prows, MD;  Location: [facility];  Service: Cardiovascular;  Laterality: N/A;  . COLONOSCOPY WITH PROPOFOL N/A 08/12/2015   Procedure: COLONOSCOPY WITH PROPOFOL;  Surgeon: Wonda Horner, MD;  Location: [facility]COPY;  Service: Endoscopy;  Laterality: N/A;  . PARTIAL HYSTERECTOMY  1979   abdominal, cancer  . TUBAL LIGATION     Social History   Occupational History  . Not on file  Tobacco Use  . Smoking status: Former Smoker    Last attempt to quit: 05/17/1999    Years since quitting: 18.3  . Smokeless tobacco: Never Used  . Tobacco comment: patient states she smoked before, quit in the 2000s  Substance and Sexual Activity  . Alcohol use: No    Alcohol/week: 0.0 oz  . Drug use: No  . Sexual activity: Not on file

## 2017-10-15 ENCOUNTER — Telehealth (INDEPENDENT_AMBULATORY_CARE_PROVIDER_SITE_OTHER): Payer: Self-pay | Admitting: Orthopaedic Surgery

## 2017-10-15 NOTE — Telephone Encounter (Signed)
Melody Flores with [facility] called needing clinical note faxed to her concerning order for Lt Knee Brace. The fax# is [phone]  The phone number is [phone]

## 2017-10-15 NOTE — Telephone Encounter (Signed)
Faxed to provided number  

## 2017-11-22 ENCOUNTER — Telehealth (INDEPENDENT_AMBULATORY_CARE_PROVIDER_SITE_OTHER): Payer: Self-pay | Admitting: Orthopaedic Surgery

## 2017-11-22 NOTE — Telephone Encounter (Signed)
Patient called to state her le was hurting really bad and needed something for pain. Want to know if  Dr Rush Farmer can call her something in.  Please call patient to advise.  TYLENOL #3

## 2017-11-26 MED ORDER — TRAMADOL HCL 50 MG PO TABS: 50.0000 mg | ORAL_TABLET | Freq: Four times a day (QID) | ORAL | 0 refills | Status: DC | PRN

## 2017-11-26 NOTE — Telephone Encounter (Signed)
Please advise 

## 2017-11-26 NOTE — Telephone Encounter (Signed)
I sent in some Tramadol to her pharmacy.

## 2018-01-09 ENCOUNTER — Encounter (INDEPENDENT_AMBULATORY_CARE_PROVIDER_SITE_OTHER): Payer: Self-pay | Admitting: Orthopaedic Surgery

## 2018-01-09 ENCOUNTER — Ambulatory Visit (INDEPENDENT_AMBULATORY_CARE_PROVIDER_SITE_OTHER): Payer: Medicare PPO | Admitting: Orthopaedic Surgery

## 2018-01-09 DIAGNOSIS — M170 Bilateral primary osteoarthritis of knee: Secondary | ICD-10-CM | POA: Diagnosis not present

## 2018-01-09 MED ORDER — METHYLPREDNISOLONE ACETATE 40 MG/ML IJ SUSP
40.0000 mg | INTRAMUSCULAR | Status: AC | PRN
  Administered 2018-01-09: 40 mg via INTRA_ARTICULAR

## 2018-01-09 MED ORDER — LIDOCAINE HCL 1 % IJ SOLN
0.5000 mL | INTRAMUSCULAR | Status: AC | PRN
  Administered 2018-01-09: .5 mL

## 2018-01-09 NOTE — Progress Notes (Signed)
   Procedure Note  Patient: Melody Flores             Date of Birth: May 30, 1946           MRN: [id number]             Visit Date: 01/09/2018  HPI: Mrs. Streater well-known to Dr. Ninfa Linden service she has severe osteoarthritis of both knees.  She states that her pain returned about 3 to 4 weeks ago.  Last cortisone injections both knees were given 09/09/2017.  She comes in today asking for cortisone injections in both knees.  Physical exam: General: Well-developed well-nourished female in no acute distress.  Psych: Alert and oriented x3 Bilateral knees: No abnormal warmth erythema.  She has good range of motion of both knees.  Procedures: Visit Diagnoses: Bilateral primary osteoarthritis of knee  Large Joint Inj: bilateral knee on 01/09/2018 4:55 PM Indications: pain Details: 22 G 1.5 in needle, anterolateral approach  Arthrogram: No  Medications (Right): 0.5 mL lidocaine 1 %; 40 mg methylPREDNISolone acetate 40 MG/ML Medications (Left): 0.5 mL lidocaine 1 %; 40 mg methylPREDNISolone acetate 40 MG/ML Outcome: tolerated well, no immediate complications Procedure, treatment alternatives, risks and benefits explained, specific risks discussed. Consent was given by the patient. Immediately prior to procedure a time out was called to verify the correct patient, procedure, equipment, support staff and site/side marked as required. Patient was prepped and draped in the usual sterile fashion.     Plan: She will follow with [place] in 3 months for repeat injections of both knees.

## 2018-04-07 ENCOUNTER — Ambulatory Visit (INDEPENDENT_AMBULATORY_CARE_PROVIDER_SITE_OTHER): Payer: Medicare PPO | Admitting: Physician Assistant

## 2018-04-10 ENCOUNTER — Ambulatory Visit (INDEPENDENT_AMBULATORY_CARE_PROVIDER_SITE_OTHER): Payer: Medicare PPO | Admitting: Physician Assistant

## 2018-04-16 ENCOUNTER — Ambulatory Visit (INDEPENDENT_AMBULATORY_CARE_PROVIDER_SITE_OTHER): Payer: Medicare PPO | Admitting: Physician Assistant

## 2018-06-10 ENCOUNTER — Other Ambulatory Visit: Payer: Self-pay | Admitting: Internal Medicine

## 2018-06-10 DIAGNOSIS — Z1231 Encounter for screening mammogram for malignant neoplasm of breast: Secondary | ICD-10-CM

## 2018-06-10 DIAGNOSIS — M810 Age-related osteoporosis without current pathological fracture: Secondary | ICD-10-CM

## 2018-08-13 ENCOUNTER — Other Ambulatory Visit: Payer: Self-pay

## 2018-08-13 ENCOUNTER — Ambulatory Visit: Payer: Self-pay

## 2018-10-08 ENCOUNTER — Ambulatory Visit: Payer: Self-pay

## 2018-10-08 ENCOUNTER — Other Ambulatory Visit: Payer: Self-pay

## 2018-12-10 ENCOUNTER — Ambulatory Visit: Payer: Self-pay

## 2018-12-10 ENCOUNTER — Other Ambulatory Visit: Payer: Self-pay

## 2019-03-02 ENCOUNTER — Emergency Department (HOSPITAL_COMMUNITY)
Admission: EM | Admit: 2019-03-02 | Discharge: 2019-03-02 | Disposition: A | Discharge status: Home / Self Care | Payer: Medicare Other | Attending: Emergency Medicine | Admitting: Emergency Medicine

## 2019-03-02 ENCOUNTER — Emergency Department (HOSPITAL_COMMUNITY): Payer: Medicare Other

## 2019-03-02 ENCOUNTER — Encounter (HOSPITAL_COMMUNITY): Payer: Self-pay

## 2019-03-02 DIAGNOSIS — I5042 Chronic combined systolic (congestive) and diastolic (congestive) heart failure: Secondary | ICD-10-CM | POA: Insufficient documentation

## 2019-03-02 DIAGNOSIS — I110 Hypertensive heart disease with heart failure: Secondary | ICD-10-CM | POA: Diagnosis not present

## 2019-03-02 DIAGNOSIS — J449 Chronic obstructive pulmonary disease, unspecified: Secondary | ICD-10-CM | POA: Insufficient documentation

## 2019-03-02 DIAGNOSIS — R05 Cough: Secondary | ICD-10-CM | POA: Diagnosis not present

## 2019-03-02 DIAGNOSIS — R112 Nausea with vomiting, unspecified: Secondary | ICD-10-CM | POA: Diagnosis not present

## 2019-03-02 DIAGNOSIS — Z79899 Other long term (current) drug therapy: Secondary | ICD-10-CM | POA: Insufficient documentation

## 2019-03-02 DIAGNOSIS — Z7982 Long term (current) use of aspirin: Secondary | ICD-10-CM | POA: Diagnosis not present

## 2019-03-02 DIAGNOSIS — J069 Acute upper respiratory infection, unspecified: Secondary | ICD-10-CM | POA: Diagnosis not present

## 2019-03-02 DIAGNOSIS — Z20828 Contact with and (suspected) exposure to other viral communicable diseases: Secondary | ICD-10-CM | POA: Insufficient documentation

## 2019-03-02 DIAGNOSIS — Z87891 Personal history of nicotine dependence: Secondary | ICD-10-CM | POA: Diagnosis not present

## 2019-03-02 DIAGNOSIS — R1110 Vomiting, unspecified: Secondary | ICD-10-CM | POA: Diagnosis present

## 2019-03-02 LAB — COMPREHENSIVE METABOLIC PANEL
ALT: 22 U/L (ref 0–44)
AST: 44 U/L — ABNORMAL HIGH (ref 15–41)
Albumin: 2.9 g/dL — ABNORMAL LOW (ref 3.5–5.0)
Alkaline Phosphatase: 78 U/L (ref 38–126)
Anion gap: 12 (ref 5–15)
BUN: 30 mg/dL — ABNORMAL HIGH (ref 8–23)
CO2: 27 mmol/L (ref 22–32)
Calcium: 8.4 mg/dL — ABNORMAL LOW (ref 8.9–10.3)
Chloride: 105 mmol/L (ref 98–111)
Creatinine, Ser: 1.43 mg/dL — ABNORMAL HIGH (ref 0.44–1.00)
GFR calc Af Amer: 42 mL/min — ABNORMAL LOW (ref >60)
GFR calc non Af Amer: 36 mL/min — ABNORMAL LOW (ref >60)
Glucose, Bld: 86 mg/dL (ref 70–99)
Potassium: 3.2 mmol/L — ABNORMAL LOW (ref 3.5–5.1)
Sodium: 144 mmol/L (ref 135–145)
Total Bilirubin: 1.1 mg/dL (ref 0.3–1.2)
Total Protein: 7.4 g/dL (ref 6.5–8.1)

## 2019-03-02 LAB — CBC WITH DIFFERENTIAL/PLATELET
Abs Immature Granulocytes: 0.09 10*3/uL — ABNORMAL HIGH (ref 0.00–0.07)
Basophils Absolute: 0.1 10*3/uL (ref 0.0–0.1)
Basophils Relative: 0 %
Eosinophils Absolute: 0.3 10*3/uL (ref 0.0–0.5)
Eosinophils Relative: 1 %
HCT: 37.5 % (ref 36.0–46.0)
Hemoglobin: 11.1 g/dL — ABNORMAL LOW (ref 12.0–15.0)
Immature Granulocytes: 1 %
Lymphocytes Relative: 15 %
Lymphs Abs: 2.8 10*3/uL (ref 0.7–4.0)
MCH: 30.6 pg (ref 26.0–34.0)
MCHC: 29.6 g/dL — ABNORMAL LOW (ref 30.0–36.0)
MCV: 103.3 fL — ABNORMAL HIGH (ref 80.0–100.0)
Monocytes Absolute: 1.7 10*3/uL — ABNORMAL HIGH (ref 0.1–1.0)
Monocytes Relative: 9 %
Neutro Abs: 13.8 10*3/uL — ABNORMAL HIGH (ref 1.7–7.7)
Neutrophils Relative %: 74 %
Platelets: 141 10*3/uL — ABNORMAL LOW (ref 150–400)
RBC: 3.63 MIL/uL — ABNORMAL LOW (ref 3.87–5.11)
RDW: 13.9 % (ref 11.5–15.5)
WBC: 18.8 10*3/uL — ABNORMAL HIGH (ref 4.0–10.5)
nRBC: 0 % (ref 0.0–0.2)

## 2019-03-02 LAB — APTT: aPTT: 25 seconds (ref 24–36)

## 2019-03-02 LAB — URINALYSIS, ROUTINE W REFLEX MICROSCOPIC
Bilirubin Urine: NEGATIVE
Glucose, UA: NEGATIVE mg/dL
Ketones, ur: NEGATIVE mg/dL
Leukocytes,Ua: NEGATIVE
Nitrite: NEGATIVE
Protein, ur: 30 mg/dL — AB
Specific Gravity, Urine: 1.016 (ref 1.005–1.030)
pH: 6 (ref 5.0–8.0)

## 2019-03-02 LAB — SARS CORONAVIRUS 2 BY RT PCR (HOSPITAL ORDER, PERFORMED IN ~~LOC~~ HOSPITAL LAB): SARS Coronavirus 2: NEGATIVE

## 2019-03-02 LAB — LACTIC ACID, PLASMA: Lactic Acid, Venous: 1.4 mmol/L (ref 0.5–1.9)

## 2019-03-02 LAB — PROTIME-INR
INR: 1.5 — ABNORMAL HIGH (ref 0.8–1.2)
Prothrombin Time: 17.5 seconds — ABNORMAL HIGH (ref 11.4–15.2)

## 2019-03-02 MED ORDER — ONDANSETRON 4 MG PO TBDP: ORAL_TABLET | ORAL | 0 refills | Status: DC

## 2019-03-02 MED ORDER — ONDANSETRON HCL 4 MG/2ML IJ SOLN
INTRAMUSCULAR | Status: AC
  Filled 2019-03-02: qty 2

## 2019-03-02 MED ORDER — ONDANSETRON HCL 4 MG/2ML IJ SOLN
4.0000 mg | Freq: Once | INTRAMUSCULAR | Status: AC
  Administered 2019-03-02: 17:00:00 4 mg via INTRAVENOUS
  Filled 2019-03-02: qty 2

## 2019-03-02 MED ORDER — DOXYCYCLINE HYCLATE 100 MG PO TABS
100.0000 mg | ORAL_TABLET | Freq: Once | ORAL | Status: AC
  Administered 2019-03-02: 22:00:00 100 mg via ORAL
  Filled 2019-03-02: qty 1

## 2019-03-02 MED ORDER — DOXYCYCLINE HYCLATE 100 MG PO CAPS: 100.0000 mg | ORAL_CAPSULE | Freq: Two times a day (BID) | ORAL | 0 refills | Status: DC

## 2019-03-02 NOTE — ED Provider Notes (Signed)
[facility] DEPT Provider Note   CSN: [id number] Arrival date & time: 03/02/19  1306     History   Chief Complaint Chief Complaint  Patient presents with  . Emesis  . Cough    HPI FRUMA VIEWEG is a 73 y.o. female.     HPI  73 year old female with history of CHF, COPD, GERD comes in a chief complaint of cough, vomiting.  Patient reports that she has been feeling sick for the last 2 weeks.  Her symptoms include nausea, vomiting, cough with green phlegm.  She has COPD, but denies any new wheezing.  She also denies any new leg swelling.  Patient's vomiting is described as bilious, typically provoked with p.o. intake.  Her nausea is present throughout the day and she has anorexia.  No abdominal pain.  Review of system is negative for fevers and chills.  Patient's granddaughter finally sent to the ER because of persistent symptoms that are not improving with home remedies.  Past Medical History:  Diagnosis Date  . Acute gouty arthritis   . Anemia   . Arthritis   . Bilateral edema of lower extremity   . Cancer ([place])    cervical cancer, had partial hysterectomy  . CHF (congestive heart failure) ([place])   . Chronic combined systolic and diastolic heart failure ([place])   . COPD (chronic obstructive pulmonary disease) ([place])   . Dilated cardiomyopathy ([place])   . Gastroesophageal reflux disease   . Gout   . Headache    sinus headaches  . Heart murmur    when she was younger, never had any problems  . Hypertension   . LBBB (left bundle branch block)   . Morbid obesity with alveolar hypoventilation ([place])   . Pneumonia   . Sleep apnea    uses cpap    Patient Active Problem List   Diagnosis Date Noted  . Chronic pain of left knee 11/29/2016  . Chronic pain of both knees 11/29/2016  . Unilateral primary osteoarthritis, left knee 11/29/2016  . Unilateral primary osteoarthritis, right knee 11/29/2016  . Dyspnea on exertion 12/12/2014  . Congestive  dilated cardiomyopathy ([place]) 12/12/2014    Past Surgical History:  Procedure Laterality Date  . CARDIAC CATHETERIZATION N/A 12/14/2014   Procedure: Right/Left Heart Cath and Coronary Angiography;  Surgeon: Adrian Prows, MD;  Location: [facility];  Service: Cardiovascular;  Laterality: N/A;  . COLONOSCOPY WITH PROPOFOL N/A 08/12/2015   Procedure: COLONOSCOPY WITH PROPOFOL;  Surgeon: Wonda Horner, MD;  Location: [facility];  Service: Endoscopy;  Laterality: N/A;  . PARTIAL HYSTERECTOMY  1979   abdominal, cancer  . TUBAL LIGATION       OB History    Gravida  0   Para  0   Term  0   Preterm  0   AB  0   Living        SAB  0   TAB  0   Ectopic  0   Multiple      Live Births               Home Medications    Prior to Admission medications   Medication Sig Start Date End Date Taking? Authorizing Provider  acetaminophen-codeine (TYLENOL #3) 300-30 MG tablet Take 1-2 tablets by mouth every 8 (eight) hours as needed for moderate pain. 09/09/17   Mcarthur Rossetti, MD  albuterol (PROVENTIL HFA;VENTOLIN HFA) 108 (90 BASE) MCG/ACT inhaler Inhale 1-2 puffs into the  lungs every 6 (six) hours as needed for wheezing or shortness of breath.    [provider]  allopurinol (ZYLOPRIM) 100 MG tablet Take 100 mg by mouth daily.    [provider]  aspirin EC 81 MG tablet Take 81 mg by mouth daily.    [provider]  carvedilol (COREG) 12.5 MG tablet Take 25 mg by mouth 2 (two) times daily with a meal.     [provider]  Dextromethorphan-Guaifenesin (CORICIDIN HBP CONGESTION/COUGH PO) Take 1 tablet by mouth 2 (two) times daily as needed (for cough and congestion).    [provider]  diclofenac (VOLTAREN) 75 MG EC tablet Take 75 mg by mouth 2 (two) times daily.    [provider]  diclofenac sodium (VOLTAREN) 1 % GEL Apply 2 g topically every 4 (four) hours as needed (for pain).     [provider]  Multiple  Vitamins-Minerals (CENTRUM SILVER ULTRA WOMENS) TABS Take 1 tablet by mouth daily.    [provider]  omeprazole (PRILOSEC) 20 MG capsule Take 20 mg by mouth daily.    [provider]  potassium chloride SA (K-DUR,KLOR-CON) 20 MEQ tablet Take 20 mEq by mouth daily. Reported on 08/11/2015    [provider]  sacubitril-valsartan (ENTRESTO) 24-26 MG Take 1 tablet by mouth 2 (two) times daily.    [provider]  traMADol (ULTRAM) 50 MG tablet Take 1-2 tablets (50-100 mg total) by mouth every 6 (six) hours as needed. 11/26/17   Mcarthur Rossetti, MD    Family History Family History  Problem Relation Age of Onset  . Hypertension Mother   . Pneumonia Mother   . Healthy Father   . Diabetes Other   . Asthma Other   . Colon cancer Daughter   . Healthy Son        X3  . Healthy Daughter        x2    Social History Social History   Tobacco Use  . Smoking status: Former Smoker    Quit date: 05/17/1999    Years since quitting: 19.8  . Smokeless tobacco: Never Used  . Tobacco comment: patient states she smoked before, quit in the 2000s  Substance Use Topics  . Alcohol use: No    Alcohol/week: 0.0 standard drinks  . Drug use: No     Allergies   Penicillins and Sulfa antibiotics   Review of Systems Review of Systems  Constitutional: Positive for activity change.  Respiratory: Positive for cough.   Cardiovascular: Negative for chest pain.  Gastrointestinal: Positive for nausea and vomiting. Negative for abdominal pain.  Genitourinary: Negative for dysuria.  Allergic/Immunologic: Negative for immunocompromised state.  Hematological: Does not bruise/bleed easily.  All other systems reviewed and are negative.    Physical Exam Updated Vital Signs BP (!) 151/90   Pulse 88   Temp 98.7 F (37.1 C) (Oral)   Resp 18   SpO2 98%   Physical Exam Vitals signs and nursing note reviewed.  Constitutional:      Appearance: She is  well-developed.  HENT:     Head: Normocephalic and atraumatic.     Nose: Congestion present.  Eyes:     Extraocular Movements: Extraocular movements intact.     Pupils: Pupils are equal, round, and reactive to light.  Neck:     Musculoskeletal: Normal range of motion and neck supple.  Cardiovascular:     Rate and Rhythm: Normal rate.  Pulmonary:  Effort: Pulmonary effort is normal.  Abdominal:     General: Bowel sounds are normal.  Skin:    General: Skin is warm and dry.  Neurological:     Mental Status: She is alert and oriented to person, place, and time.      ED Treatments / Results  Labs (all labs ordered are listed, but only abnormal results are displayed) Labs Reviewed  CULTURE, BLOOD (ROUTINE X 2)  CULTURE, BLOOD (ROUTINE X 2)  URINE CULTURE  SARS CORONAVIRUS 2 (HOSPITAL ORDER, [facility])  LACTIC ACID, PLASMA  LACTIC ACID, PLASMA  COMPREHENSIVE METABOLIC PANEL  CBC WITH DIFFERENTIAL/PLATELET  APTT  PROTIME-INR  URINALYSIS, ROUTINE W REFLEX MICROSCOPIC    EKG None  Radiology Dg Chest Port 1 View  Result Date: 03/02/2019 CLINICAL DATA:  Nausea vomiting. EXAM: PORTABLE CHEST 1 VIEW COMPARISON:  January 25, 2012 FINDINGS: The heart size and mediastinal contours are within normal limits. Both lungs are clear. The visualized skeletal structures are unremarkable. IMPRESSION: No active disease. Electronically Signed   By: Dorise Bullion III M.D   On: 03/02/2019 13:52    Procedures Procedures (including critical care time)  Medications Ordered in ED Medications  ondansetron (ZOFRAN) injection 4 mg (has no administration in time range)     Initial Impression / Assessment and Plan / ED Course  I have reviewed the triage vital signs and the nursing notes.  Pertinent labs & imaging results that were available during my care of the patient were reviewed by me and considered in my medical decision making (see chart for details).         73 year old female with history of COPD, CHF comes in a chief complaint of nausea, vomiting, cough.  On exam patient is not in any acute distress.  She has no focal abdominal tenderness.  Lung exam is negative, however patient is morbidly obese and the exam is limited.  Chest x-ray is not showing any acute findings.  We have ordered basic labs, UA.  Differential diagnosis includes UTI /pyelonephritis, ileus, COVID-19 infection.  COPD exacerbation is also possible.  With a normal-appearing chest x-ray, plan is to follow-up on the basic labs.  If the labs are reassuring then we will initiate oral challenge.  If patient passes oral challenge then she can go home with a diagnosis of COPD exacerbation with a prescription for doxycycline and prednisone.  If patient's labs are abnormal or if she fails p.o. challenge then she might need admission or further work-up.  Final Clinical Impressions(s) / ED Diagnoses   Final diagnoses:  None    ED Discharge Orders    None       Varney Biles, MD 03/02/19 1531

## 2019-03-02 NOTE — ED Provider Notes (Signed)
Patient is tolerating oral intake.  Urine without evidence of UTI.  Patient does have a elevation in her white blood cell count.  Suspect possible infectious COPD exacerbation.  Will discharge home with course of doxycycline.  First dose given in emergency department.  She is advised to follow-up closely with her primary physician and return precautions given.   Julianne Rice, MD 03/02/19 2149

## 2019-03-02 NOTE — ED Notes (Signed)
Unsuccessful IV attempt x1. 

## 2019-03-02 NOTE — ED Triage Notes (Signed)
Patient is from home and transported via [facility]. Patient had had two weeks of intermittent nausea with vomiting everytime she either eats or drinks. Also, complains of a productive cough for the last 2-3 weeks. Patient informed EMS she has had a fever but does not have one now. EMS states it was warm in the home with lots of little children in the home.

## 2019-03-02 NOTE — ED Notes (Signed)
ED Provider at bedside. 

## 2019-03-03 ENCOUNTER — Telehealth (HOSPITAL_BASED_OUTPATIENT_CLINIC_OR_DEPARTMENT_OTHER): Payer: Self-pay | Admitting: Emergency Medicine

## 2019-03-03 LAB — BLOOD CULTURE ID PANEL (REFLEXED)

## 2019-03-03 NOTE — Telephone Encounter (Signed)
Received call from [facility] lab with positive blood culture. Consulted with Dr. Tyrone Nine who reviewed chart. Advised pt received abx that covered staph and no further treatment would be necessary. Will have day shift follow up with pt to see if she is improving.

## 2019-03-04 LAB — URINE CULTURE: Culture: NO GROWTH

## 2019-03-05 LAB — CULTURE, BLOOD (ROUTINE X 2)

## 2019-03-06 ENCOUNTER — Telehealth: Payer: Self-pay

## 2019-03-06 NOTE — Telephone Encounter (Signed)
Post ED Visit - Positive Culture Follow-up  Culture report reviewed by antimicrobial stewardship pharmacist: [place] Team []  Elenor Quinones, Pharm.D. []  Heide Guile, Pharm.D., BCPS AQ-ID []  Parks Neptune, Pharm.D., BCPS []  Alycia Rossetti, Pharm.D., BCPS []  [place], Pharm.D., BCPS, AAHIVP []  Legrand Como, Pharm.D., BCPS, AAHIVP []  Salome Arnt, PharmD, BCPS []  Johnnette Gourd, PharmD, BCPS []  Hughes Better, PharmD, BCPS []  Leeroy Cha, PharmD []  Laqueta Linden, PharmD, BCPS []  Albertina Parr, PharmD  [place] Team []  Leodis Sias, PharmD []  Lindell Spar, PharmD []  Royetta Asal, PharmD []  Graylin Shiver, Rph []  Rema Fendt) Glennon Mac, PharmD []  Arlyn Dunning, PharmD []  Netta Cedars, PharmD [x]  Dia Sitter, PharmD []  Leone Haven, PharmD []  Gretta Arab, PharmD []  Theodis Shove, PharmD []  Peggyann Juba, PharmD []  Reuel Boom, PharmD   Positive [facility] culture Treated with Doxycycline, organism sensitive to the same and no further patient follow-up is required at this time.  Genia Del 03/06/2019, 11:19 AM

## 2019-03-07 LAB — CULTURE, BLOOD (ROUTINE X 2): Culture: NO GROWTH

## 2019-08-01 ENCOUNTER — Inpatient Hospital Stay (HOSPITAL_COMMUNITY)
Admission: EM | Admit: 2019-08-01 | Discharge: 2019-08-31 | DRG: 208 | Disposition: E | Payer: Medicare Other | Attending: Internal Medicine | Admitting: Internal Medicine

## 2019-08-01 DIAGNOSIS — J9601 Acute respiratory failure with hypoxia: Secondary | ICD-10-CM

## 2019-08-01 DIAGNOSIS — I5042 Chronic combined systolic (congestive) and diastolic (congestive) heart failure: Secondary | ICD-10-CM | POA: Diagnosis present

## 2019-08-01 DIAGNOSIS — I420 Dilated cardiomyopathy: Secondary | ICD-10-CM | POA: Diagnosis present

## 2019-08-01 DIAGNOSIS — G936 Cerebral edema: Secondary | ICD-10-CM | POA: Diagnosis present

## 2019-08-01 DIAGNOSIS — I110 Hypertensive heart disease with heart failure: Secondary | ICD-10-CM | POA: Diagnosis present

## 2019-08-01 DIAGNOSIS — U071 COVID-19: Principal | ICD-10-CM

## 2019-08-01 DIAGNOSIS — Z515 Encounter for palliative care: Secondary | ICD-10-CM | POA: Diagnosis not present

## 2019-08-01 DIAGNOSIS — M109 Gout, unspecified: Secondary | ICD-10-CM | POA: Diagnosis present

## 2019-08-01 DIAGNOSIS — Z825 Family history of asthma and other chronic lower respiratory diseases: Secondary | ICD-10-CM

## 2019-08-01 DIAGNOSIS — G4733 Obstructive sleep apnea (adult) (pediatric): Secondary | ICD-10-CM | POA: Diagnosis present

## 2019-08-01 DIAGNOSIS — Z79891 Long term (current) use of opiate analgesic: Secondary | ICD-10-CM

## 2019-08-01 DIAGNOSIS — G253 Myoclonus: Secondary | ICD-10-CM | POA: Diagnosis present

## 2019-08-01 DIAGNOSIS — Z8541 Personal history of malignant neoplasm of cervix uteri: Secondary | ICD-10-CM

## 2019-08-01 DIAGNOSIS — I2510 Atherosclerotic heart disease of native coronary artery without angina pectoris: Secondary | ICD-10-CM | POA: Diagnosis present

## 2019-08-01 DIAGNOSIS — E872 Acidosis: Secondary | ICD-10-CM | POA: Diagnosis present

## 2019-08-01 DIAGNOSIS — I469 Cardiac arrest, cause unspecified: Secondary | ICD-10-CM

## 2019-08-01 DIAGNOSIS — Z90710 Acquired absence of both cervix and uterus: Secondary | ICD-10-CM

## 2019-08-01 DIAGNOSIS — Z8249 Family history of ischemic heart disease and other diseases of the circulatory system: Secondary | ICD-10-CM

## 2019-08-01 DIAGNOSIS — Z7982 Long term (current) use of aspirin: Secondary | ICD-10-CM

## 2019-08-01 DIAGNOSIS — R7989 Other specified abnormal findings of blood chemistry: Secondary | ICD-10-CM

## 2019-08-01 DIAGNOSIS — K219 Gastro-esophageal reflux disease without esophagitis: Secondary | ICD-10-CM | POA: Diagnosis present

## 2019-08-01 DIAGNOSIS — I447 Left bundle-branch block, unspecified: Secondary | ICD-10-CM | POA: Diagnosis present

## 2019-08-01 DIAGNOSIS — D649 Anemia, unspecified: Secondary | ICD-10-CM

## 2019-08-01 DIAGNOSIS — I272 Pulmonary hypertension, unspecified: Secondary | ICD-10-CM | POA: Diagnosis present

## 2019-08-01 DIAGNOSIS — N179 Acute kidney failure, unspecified: Secondary | ICD-10-CM

## 2019-08-01 DIAGNOSIS — Z9289 Personal history of other medical treatment: Secondary | ICD-10-CM

## 2019-08-01 DIAGNOSIS — N390 Urinary tract infection, site not specified: Secondary | ICD-10-CM | POA: Diagnosis present

## 2019-08-01 DIAGNOSIS — R778 Other specified abnormalities of plasma proteins: Secondary | ICD-10-CM

## 2019-08-01 DIAGNOSIS — J1282 Pneumonia due to coronavirus disease 2019: Secondary | ICD-10-CM | POA: Diagnosis present

## 2019-08-01 DIAGNOSIS — Z66 Do not resuscitate: Secondary | ICD-10-CM | POA: Diagnosis not present

## 2019-08-01 DIAGNOSIS — I468 Cardiac arrest due to other underlying condition: Secondary | ICD-10-CM | POA: Diagnosis present

## 2019-08-01 DIAGNOSIS — G8929 Other chronic pain: Secondary | ICD-10-CM | POA: Diagnosis present

## 2019-08-01 DIAGNOSIS — I472 Ventricular tachycardia: Secondary | ICD-10-CM | POA: Diagnosis present

## 2019-08-01 DIAGNOSIS — J440 Chronic obstructive pulmonary disease with acute lower respiratory infection: Secondary | ICD-10-CM | POA: Diagnosis present

## 2019-08-01 DIAGNOSIS — Z87891 Personal history of nicotine dependence: Secondary | ICD-10-CM

## 2019-08-01 DIAGNOSIS — G931 Anoxic brain damage, not elsewhere classified: Secondary | ICD-10-CM

## 2019-08-01 DIAGNOSIS — Z6841 Body Mass Index (BMI) 40.0 and over, adult: Secondary | ICD-10-CM

## 2019-08-01 DIAGNOSIS — Z882 Allergy status to sulfonamides status: Secondary | ICD-10-CM

## 2019-08-01 DIAGNOSIS — Z880 Allergy status to penicillin: Secondary | ICD-10-CM

## 2019-08-01 DIAGNOSIS — G92 Toxic encephalopathy: Secondary | ICD-10-CM | POA: Diagnosis present

## 2019-08-01 DIAGNOSIS — Z79899 Other long term (current) drug therapy: Secondary | ICD-10-CM

## 2019-08-02 ENCOUNTER — Inpatient Hospital Stay (HOSPITAL_COMMUNITY): Payer: Medicare Other

## 2019-08-02 ENCOUNTER — Emergency Department (HOSPITAL_COMMUNITY): Payer: Medicare Other

## 2019-08-02 DIAGNOSIS — I428 Other cardiomyopathies: Secondary | ICD-10-CM

## 2019-08-02 DIAGNOSIS — N179 Acute kidney failure, unspecified: Secondary | ICD-10-CM | POA: Diagnosis not present

## 2019-08-02 DIAGNOSIS — R778 Other specified abnormalities of plasma proteins: Secondary | ICD-10-CM | POA: Diagnosis not present

## 2019-08-02 DIAGNOSIS — I420 Dilated cardiomyopathy: Secondary | ICD-10-CM | POA: Diagnosis present

## 2019-08-02 DIAGNOSIS — I469 Cardiac arrest, cause unspecified: Secondary | ICD-10-CM | POA: Diagnosis present

## 2019-08-02 DIAGNOSIS — G8929 Other chronic pain: Secondary | ICD-10-CM | POA: Diagnosis present

## 2019-08-02 DIAGNOSIS — G92 Toxic encephalopathy: Secondary | ICD-10-CM | POA: Diagnosis present

## 2019-08-02 DIAGNOSIS — G931 Anoxic brain damage, not elsewhere classified: Secondary | ICD-10-CM | POA: Diagnosis present

## 2019-08-02 DIAGNOSIS — N390 Urinary tract infection, site not specified: Secondary | ICD-10-CM | POA: Diagnosis present

## 2019-08-02 DIAGNOSIS — J9601 Acute respiratory failure with hypoxia: Secondary | ICD-10-CM | POA: Diagnosis present

## 2019-08-02 DIAGNOSIS — G4733 Obstructive sleep apnea (adult) (pediatric): Secondary | ICD-10-CM | POA: Diagnosis present

## 2019-08-02 DIAGNOSIS — U071 COVID-19: Secondary | ICD-10-CM | POA: Diagnosis present

## 2019-08-02 DIAGNOSIS — J1282 Pneumonia due to coronavirus disease 2019: Secondary | ICD-10-CM | POA: Diagnosis present

## 2019-08-02 DIAGNOSIS — I447 Left bundle-branch block, unspecified: Secondary | ICD-10-CM | POA: Diagnosis present

## 2019-08-02 DIAGNOSIS — G936 Cerebral edema: Secondary | ICD-10-CM | POA: Diagnosis present

## 2019-08-02 DIAGNOSIS — G253 Myoclonus: Secondary | ICD-10-CM | POA: Diagnosis not present

## 2019-08-02 DIAGNOSIS — R7989 Other specified abnormal findings of blood chemistry: Secondary | ICD-10-CM | POA: Insufficient documentation

## 2019-08-02 DIAGNOSIS — K219 Gastro-esophageal reflux disease without esophagitis: Secondary | ICD-10-CM | POA: Diagnosis present

## 2019-08-02 DIAGNOSIS — I110 Hypertensive heart disease with heart failure: Secondary | ICD-10-CM | POA: Diagnosis present

## 2019-08-02 DIAGNOSIS — Z66 Do not resuscitate: Secondary | ICD-10-CM | POA: Diagnosis not present

## 2019-08-02 DIAGNOSIS — I468 Cardiac arrest due to other underlying condition: Secondary | ICD-10-CM | POA: Diagnosis present

## 2019-08-02 DIAGNOSIS — R9401 Abnormal electroencephalogram [EEG]: Secondary | ICD-10-CM | POA: Diagnosis not present

## 2019-08-02 DIAGNOSIS — I472 Ventricular tachycardia: Secondary | ICD-10-CM | POA: Diagnosis present

## 2019-08-02 DIAGNOSIS — M109 Gout, unspecified: Secondary | ICD-10-CM | POA: Diagnosis present

## 2019-08-02 DIAGNOSIS — Z515 Encounter for palliative care: Secondary | ICD-10-CM | POA: Diagnosis not present

## 2019-08-02 DIAGNOSIS — E872 Acidosis: Secondary | ICD-10-CM | POA: Diagnosis present

## 2019-08-02 DIAGNOSIS — I5042 Chronic combined systolic (congestive) and diastolic (congestive) heart failure: Secondary | ICD-10-CM | POA: Diagnosis present

## 2019-08-02 DIAGNOSIS — J440 Chronic obstructive pulmonary disease with acute lower respiratory infection: Secondary | ICD-10-CM | POA: Diagnosis present

## 2019-08-02 DIAGNOSIS — Z6841 Body Mass Index (BMI) 40.0 and over, adult: Secondary | ICD-10-CM | POA: Diagnosis not present

## 2019-08-02 LAB — GLUCOSE, CAPILLARY
Glucose-Capillary: 100 mg/dL — ABNORMAL HIGH (ref 70–99)
Glucose-Capillary: 106 mg/dL — ABNORMAL HIGH (ref 70–99)
Glucose-Capillary: 114 mg/dL — ABNORMAL HIGH (ref 70–99)
Glucose-Capillary: 123 mg/dL — ABNORMAL HIGH (ref 70–99)
Glucose-Capillary: 97 mg/dL (ref 70–99)

## 2019-08-02 LAB — URINALYSIS, ROUTINE W REFLEX MICROSCOPIC
Bilirubin Urine: NEGATIVE
Glucose, UA: NEGATIVE mg/dL
Ketones, ur: NEGATIVE mg/dL
Nitrite: POSITIVE — AB
Protein, ur: 100 mg/dL — AB
Specific Gravity, Urine: 1.011 (ref 1.005–1.030)
WBC, UA: >50 WBC/hpf — ABNORMAL HIGH (ref 0–5)
pH: 7 (ref 5.0–8.0)

## 2019-08-02 LAB — CBC WITH DIFFERENTIAL/PLATELET
Abs Immature Granulocytes: 1.14 10*3/uL — ABNORMAL HIGH (ref 0.00–0.07)
Basophils Absolute: 0 10*3/uL (ref 0.0–0.1)
Basophils Relative: 0 %
Eosinophils Absolute: 0.1 10*3/uL (ref 0.0–0.5)
Eosinophils Relative: 1 %
HCT: 33.3 % — ABNORMAL LOW (ref 36.0–46.0)
Hemoglobin: 9.9 g/dL — ABNORMAL LOW (ref 12.0–15.0)
Immature Granulocytes: 5 %
Lymphocytes Relative: 24 %
Lymphs Abs: 5.3 10*3/uL — ABNORMAL HIGH (ref 0.7–4.0)
MCH: 31.1 pg (ref 26.0–34.0)
MCHC: 29.7 g/dL — ABNORMAL LOW (ref 30.0–36.0)
MCV: 104.7 fL — ABNORMAL HIGH (ref 80.0–100.0)
Monocytes Absolute: 0.4 10*3/uL (ref 0.1–1.0)
Monocytes Relative: 2 %
Neutro Abs: 15.1 10*3/uL — ABNORMAL HIGH (ref 1.7–7.7)
Neutrophils Relative %: 68 %
Platelets: 97 10*3/uL — ABNORMAL LOW (ref 150–400)
RBC: 3.18 MIL/uL — ABNORMAL LOW (ref 3.87–5.11)
RDW: 13.5 % (ref 11.5–15.5)
WBC: 22.1 10*3/uL — ABNORMAL HIGH (ref 4.0–10.5)
nRBC: 0 % (ref 0.0–0.2)

## 2019-08-02 LAB — PROTIME-INR
INR: 1.4 — ABNORMAL HIGH (ref 0.8–1.2)
Prothrombin Time: 16.7 seconds — ABNORMAL HIGH (ref 11.4–15.2)

## 2019-08-02 LAB — POCT I-STAT EG7
Acid-base deficit: 7 mmol/L — ABNORMAL HIGH (ref 0.0–2.0)
Bicarbonate: 20.3 mmol/L (ref 20.0–28.0)
Calcium, Ion: 1.14 mmol/L — ABNORMAL LOW (ref 1.15–1.40)
HCT: 32 % — ABNORMAL LOW (ref 36.0–46.0)
Hemoglobin: 10.9 g/dL — ABNORMAL LOW (ref 12.0–15.0)
O2 Saturation: 100 %
Patient temperature: 96.5
Potassium: 4.6 mmol/L (ref 3.5–5.1)
Sodium: 140 mmol/L (ref 135–145)
TCO2: 22 mmol/L (ref 22–32)
pCO2, Ven: 45.4 mmHg (ref 44.0–60.0)
pH, Ven: 7.252 (ref 7.250–7.430)
pO2, Ven: 338 mmHg — ABNORMAL HIGH (ref 32.0–45.0)

## 2019-08-02 LAB — COMPREHENSIVE METABOLIC PANEL
ALT: 54 U/L — ABNORMAL HIGH (ref 0–44)
ALT: 72 U/L — ABNORMAL HIGH (ref 0–44)
AST: 105 U/L — ABNORMAL HIGH (ref 15–41)
AST: 159 U/L — ABNORMAL HIGH (ref 15–41)
Albumin: 2.4 g/dL — ABNORMAL LOW (ref 3.5–5.0)
Albumin: 2.7 g/dL — ABNORMAL LOW (ref 3.5–5.0)
Alkaline Phosphatase: 104 U/L (ref 38–126)
Alkaline Phosphatase: 120 U/L (ref 38–126)
Anion gap: 16 — ABNORMAL HIGH (ref 5–15)
Anion gap: 12 (ref 5–15)
BUN: 14 mg/dL (ref 8–23)
BUN: 18 mg/dL (ref 8–23)
CO2: 17 mmol/L — ABNORMAL LOW (ref 22–32)
CO2: 22 mmol/L (ref 22–32)
Calcium: 8 mg/dL — ABNORMAL LOW (ref 8.9–10.3)
Calcium: 8.3 mg/dL — ABNORMAL LOW (ref 8.9–10.3)
Chloride: 106 mmol/L (ref 98–111)
Chloride: 106 mmol/L (ref 98–111)
Creatinine, Ser: 1.86 mg/dL — ABNORMAL HIGH (ref 0.44–1.00)
Creatinine, Ser: 2.05 mg/dL — ABNORMAL HIGH (ref 0.44–1.00)
GFR calc Af Amer: 30 mL/min — ABNORMAL LOW (ref >60)
GFR calc Af Amer: 27 mL/min — ABNORMAL LOW (ref >60)
GFR calc non Af Amer: 26 mL/min — ABNORMAL LOW (ref >60)
GFR calc non Af Amer: 23 mL/min — ABNORMAL LOW (ref >60)
Glucose, Bld: 167 mg/dL — ABNORMAL HIGH (ref 70–99)
Glucose, Bld: 163 mg/dL — ABNORMAL HIGH (ref 70–99)
Potassium: 4.8 mmol/L (ref 3.5–5.1)
Potassium: 4 mmol/L (ref 3.5–5.1)
Sodium: 139 mmol/L (ref 135–145)
Sodium: 140 mmol/L (ref 135–145)
Total Bilirubin: 0.5 mg/dL (ref 0.3–1.2)
Total Bilirubin: 1 mg/dL (ref 0.3–1.2)
Total Protein: 6.5 g/dL (ref 6.5–8.1)
Total Protein: 7.2 g/dL (ref 6.5–8.1)

## 2019-08-02 LAB — POCT I-STAT 7, (LYTES, BLD GAS, ICA,H+H)
Acid-base deficit: 2 mmol/L (ref 0.0–2.0)
Bicarbonate: 22.1 mmol/L (ref 20.0–28.0)
Calcium, Ion: 1.17 mmol/L (ref 1.15–1.40)
HCT: 36 % (ref 36.0–46.0)
Hemoglobin: 12.2 g/dL (ref 12.0–15.0)
O2 Saturation: 90 %
Patient temperature: 101.2
Potassium: 3.9 mmol/L (ref 3.5–5.1)
Sodium: 140 mmol/L (ref 135–145)
TCO2: 23 mmol/L (ref 22–32)
pCO2 arterial: 38.9 mmHg (ref 32.0–48.0)
pH, Arterial: 7.368 (ref 7.350–7.450)
pO2, Arterial: 65 mmHg — ABNORMAL LOW (ref 83.0–108.0)

## 2019-08-02 LAB — LIPID PANEL
Cholesterol: 136 mg/dL (ref 0–200)
HDL: 43 mg/dL (ref >40)
LDL Cholesterol: 80 mg/dL (ref 0–99)
Total CHOL/HDL Ratio: 3.2 RATIO
Triglycerides: 66 mg/dL (ref <150)
VLDL: 13 mg/dL (ref 0–40)

## 2019-08-02 LAB — CBC
HCT: 36 % (ref 36.0–46.0)
Hemoglobin: 11.2 g/dL — ABNORMAL LOW (ref 12.0–15.0)
MCH: 30.5 pg (ref 26.0–34.0)
MCHC: 31.1 g/dL (ref 30.0–36.0)
MCV: 98.1 fL (ref 80.0–100.0)
Platelets: 137 10*3/uL — ABNORMAL LOW (ref 150–400)
RBC: 3.67 MIL/uL — ABNORMAL LOW (ref 3.87–5.11)
RDW: 13.6 % (ref 11.5–15.5)
WBC: 11.3 10*3/uL — ABNORMAL HIGH (ref 4.0–10.5)
nRBC: 0 % (ref 0.0–0.2)

## 2019-08-02 LAB — ABO/RH: ABO/RH(D): A POS

## 2019-08-02 LAB — HEMOGLOBIN A1C
Hgb A1c MFr Bld: 5.6 % (ref 4.8–5.6)
Mean Plasma Glucose: 114.02 mg/dL

## 2019-08-02 LAB — RAPID URINE DRUG SCREEN, HOSP PERFORMED
Amphetamines: NOT DETECTED
Barbiturates: NOT DETECTED
Benzodiazepines: NOT DETECTED
Cocaine: NOT DETECTED
Opiates: NOT DETECTED
Tetrahydrocannabinol: NOT DETECTED

## 2019-08-02 LAB — LACTIC ACID, PLASMA: Lactic Acid, Venous: 8.9 mmol/L (ref 0.5–1.9)

## 2019-08-02 LAB — MAGNESIUM: Magnesium: 1.9 mg/dL (ref 1.7–2.4)

## 2019-08-02 LAB — BRAIN NATRIURETIC PEPTIDE: B Natriuretic Peptide: 1539.8 pg/mL — ABNORMAL HIGH (ref 0.0–100.0)

## 2019-08-02 LAB — APTT: aPTT: 46 seconds — ABNORMAL HIGH (ref 24–36)

## 2019-08-02 LAB — ECHOCARDIOGRAM COMPLETE
Height: 68 in
Weight: 5280 oz

## 2019-08-02 LAB — RESPIRATORY PANEL BY RT PCR (FLU A&B, COVID)
Influenza A by PCR: NEGATIVE
Influenza B by PCR: NEGATIVE
SARS Coronavirus 2 by RT PCR: POSITIVE — AB

## 2019-08-02 LAB — TROPONIN I (HIGH SENSITIVITY)
Troponin I (High Sensitivity): 29 ng/L — ABNORMAL HIGH (ref <18)
Troponin I (High Sensitivity): 214 ng/L (ref <18)

## 2019-08-02 LAB — LACTATE DEHYDROGENASE: LDH: 517 U/L — ABNORMAL HIGH (ref 98–192)

## 2019-08-02 LAB — D-DIMER, QUANTITATIVE: D-Dimer, Quant: >20 ug/mL-FEU — ABNORMAL HIGH (ref 0.00–0.50)

## 2019-08-02 LAB — ETHANOL: Alcohol, Ethyl (B): <10 mg/dL (ref <10)

## 2019-08-02 LAB — CBG MONITORING, ED: Glucose-Capillary: 122 mg/dL — ABNORMAL HIGH (ref 70–99)

## 2019-08-02 LAB — MRSA PCR SCREENING

## 2019-08-02 LAB — FERRITIN: Ferritin: 639 ng/mL — ABNORMAL HIGH (ref 11–307)

## 2019-08-02 LAB — C-REACTIVE PROTEIN: CRP: 11.1 mg/dL — ABNORMAL HIGH (ref <1.0)

## 2019-08-02 MED ORDER — LEVOFLOXACIN IN D5W 750 MG/150ML IV SOLN
750.0000 mg | Freq: Once | INTRAVENOUS | Status: AC
  Administered 2019-08-02: 750 mg via INTRAVENOUS
  Filled 2019-08-02: qty 150

## 2019-08-02 MED ORDER — ACETAMINOPHEN 160 MG/5ML PO SOLN
650.0000 mg | ORAL | Status: DC | PRN
  Administered 2019-08-02 – 2019-08-04 (×3): 650 mg
  Filled 2019-08-02 (×4): qty 20.3

## 2019-08-02 MED ORDER — FENTANYL BOLUS VIA INFUSION
25.0000 ug | INTRAVENOUS | Status: DC | PRN
  Filled 2019-08-02: qty 25

## 2019-08-02 MED ORDER — PROPOFOL 1000 MG/100ML IV EMUL
INTRAVENOUS | Status: AC
  Filled 2019-08-02: qty 100

## 2019-08-02 MED ORDER — SODIUM CHLORIDE 0.9 % IV SOLN: 500.0000 mg | Freq: Once | INTRAVENOUS | Status: DC

## 2019-08-02 MED ORDER — LEVETIRACETAM IN NACL 1000 MG/100ML IV SOLN
1000.0000 mg | INTRAVENOUS | Status: AC
  Administered 2019-08-02: 1000 mg via INTRAVENOUS
  Filled 2019-08-02: qty 100

## 2019-08-02 MED ORDER — FENTANYL CITRATE (PF) 100 MCG/2ML IJ SOLN
25.0000 ug | Freq: Once | INTRAMUSCULAR | Status: AC
Start: 2019-08-02 — End: 2019-08-02
  Administered 2019-08-02: 25 ug via INTRAVENOUS
  Filled 2019-08-02: qty 2

## 2019-08-02 MED ORDER — ORAL CARE MOUTH RINSE
15.0000 mL | Freq: Two times a day (BID) | OROMUCOSAL | Status: DC
  Administered 2019-08-02 (×2): 15 mL via OROMUCOSAL

## 2019-08-02 MED ORDER — SODIUM CHLORIDE 0.9 % IV SOLN: 2.0000 g | Freq: Once | INTRAVENOUS | Status: DC

## 2019-08-02 MED ORDER — IPRATROPIUM-ALBUTEROL 0.5-2.5 (3) MG/3ML IN SOLN
3.0000 mL | Freq: Four times a day (QID) | RESPIRATORY_TRACT | Status: DC
  Administered 2019-08-02 – 2019-08-04 (×11): 3 mL via RESPIRATORY_TRACT
  Filled 2019-08-02 (×11): qty 3

## 2019-08-02 MED ORDER — LEVETIRACETAM IN NACL 500 MG/100ML IV SOLN
500.0000 mg | Freq: Two times a day (BID) | INTRAVENOUS | Status: DC
  Administered 2019-08-03 – 2019-08-04 (×3): 500 mg via INTRAVENOUS
  Filled 2019-08-02 (×3): qty 100

## 2019-08-02 MED ORDER — CHLORHEXIDINE GLUCONATE CLOTH 2 % EX PADS
6.0000 | MEDICATED_PAD | Freq: Every day | CUTANEOUS | Status: DC
  Administered 2019-08-02 – 2019-08-03 (×2): 6 via TOPICAL

## 2019-08-02 MED ORDER — MIDAZOLAM HCL 2 MG/2ML IJ SOLN
INTRAMUSCULAR | Status: AC
  Filled 2019-08-02: qty 6

## 2019-08-02 MED ORDER — EPINEPHRINE PF 1 MG/ML IJ SOLN
0.5000 ug/min | INTRAVENOUS | Status: DC
  Administered 2019-08-02: 01:00:00 0.5 ug/min via INTRAVENOUS
  Filled 2019-08-02: qty 4

## 2019-08-02 MED ORDER — PROPOFOL 1000 MG/100ML IV EMUL
5.0000 ug/kg/min | INTRAVENOUS | Status: DC
  Administered 2019-08-02: 5 ug/kg/min via INTRAVENOUS
  Filled 2019-08-02: qty 100

## 2019-08-02 MED ORDER — PANTOPRAZOLE SODIUM 40 MG IV SOLR
40.0000 mg | Freq: Every day | INTRAVENOUS | Status: DC
  Administered 2019-08-02 – 2019-08-03 (×2): 40 mg via INTRAVENOUS
  Filled 2019-08-02 (×2): qty 40

## 2019-08-02 MED ORDER — SODIUM CHLORIDE 0.9 % IV SOLN
100.0000 mg | Freq: Every day | INTRAVENOUS | Status: DC
  Administered 2019-08-03: 100 mg via INTRAVENOUS
  Filled 2019-08-02 (×2): qty 20

## 2019-08-02 MED ORDER — LEVOFLOXACIN IN D5W 750 MG/150ML IV SOLN
750.0000 mg | INTRAVENOUS | Status: DC
  Administered 2019-08-03: 750 mg via INTRAVENOUS
  Filled 2019-08-02: qty 150

## 2019-08-02 MED ORDER — MIDAZOLAM HCL 2 MG/2ML IJ SOLN
5.0000 mg | Freq: Once | INTRAMUSCULAR | Status: AC
  Administered 2019-08-02: 5 mg via INTRAVENOUS

## 2019-08-02 MED ORDER — CHLORHEXIDINE GLUCONATE 0.12 % MT SOLN
15.0000 mL | Freq: Two times a day (BID) | OROMUCOSAL | Status: DC
  Administered 2019-08-02 – 2019-08-03 (×4): 15 mL via OROMUCOSAL
  Filled 2019-08-02 (×4): qty 15

## 2019-08-02 MED ORDER — ASPIRIN EC 81 MG PO TBEC
81.0000 mg | DELAYED_RELEASE_TABLET | Freq: Every day | ORAL | Status: DC
Start: 2019-08-02 — End: 2019-08-02

## 2019-08-02 MED ORDER — DEXAMETHASONE SODIUM PHOSPHATE 10 MG/ML IJ SOLN
6.0000 mg | INTRAMUSCULAR | Status: DC
  Administered 2019-08-02 – 2019-08-04 (×3): 6 mg via INTRAVENOUS
  Filled 2019-08-02 (×3): qty 1

## 2019-08-02 MED ORDER — FENTANYL 2500MCG IN NS 250ML (10MCG/ML) PREMIX INFUSION
25.0000 ug/h | INTRAVENOUS | Status: DC
  Administered 2019-08-02: 25 ug/h via INTRAVENOUS
  Filled 2019-08-02: qty 250

## 2019-08-02 MED ORDER — HEPARIN SODIUM (PORCINE) 5000 UNIT/ML IJ SOLN
5000.0000 [IU] | Freq: Three times a day (TID) | INTRAMUSCULAR | Status: DC
  Administered 2019-08-02 – 2019-08-04 (×6): 5000 [IU] via SUBCUTANEOUS
  Filled 2019-08-02 (×6): qty 1

## 2019-08-02 MED ORDER — SODIUM CHLORIDE 0.9 % IV SOLN
200.0000 mg | Freq: Once | INTRAVENOUS | Status: AC
  Administered 2019-08-02: 200 mg via INTRAVENOUS
  Filled 2019-08-02: qty 200

## 2019-08-02 NOTE — ED Notes (Signed)
Date and time results received: 08/02/19   (use smartphrase ".now" to insert current time)  Test: Trop  Critical Value: 214  Name of Provider Notified: Dr. Ambrose Pancoast  Orders Received? Or Actions Taken?: none

## 2019-08-02 NOTE — Consult Note (Signed)
Cardiology Consultation:   Patient ID: Melody Flores MRN: [id number]; DOB: [birth year]  Admit date: 07/27/2019 Date of Consult: 08/02/2019  Primary Care Provider: Nolene Ebbs, MD Primary Cardiologist: No primary care provider on file.  Primary Electrophysiologist:  None    Patient Profile:   Melody Flores is a 74 y.o. female with a hx of non-obstructive CAD, non-ischemic cardiomyopathy with HFrEF, COPD, obesity, HTN, OSA, who is being seen today following cardiac arrest at the request of Melody Flores.  History of Present Illness:   Melody Flores is a 74 y.o. female with a hx of non-obstructive CAD, non-ischemic cardiomyopathy with HFrEF, COPD, obesity, HTN, OSA, who is being seen today following cardiac arrest at the request of Melody Flores.  The patient has a history of non-ischemic cardiomyopathy with HFrEF. She underwent TTE in 2016 that showed LVEF 20-25% and subsequent LHC in 2016 with Dr. Einar Flores that showed 30% LAD lesion. She has had no apparent/known cardiology follow up since that time, although her HF has been treated with Entresto and carvedilol.  [name] was brought to the ED this evening after an out of hospital arrest. The patient was reportedly evaluated by EMS at her home for dyspnea and was witnessed by EMS to lose consciousness and have an arrest. Reports of her rhythm during the arrest are variable, and her primary rhythm was PEA and/or asystole, although she also reportedly had VT for which she was defibrillated and received amiodarone.  On arrival to the ED, the patient was undergoing active CPR, and ROSC was ultimately achieved after >30 minutes of ACLS. ECG showed (known, intermittent) LBBB. Labs included troponin of 29, lactate 8.9, Cr 1.8, WBC 22.1. COVID positive. CXR showed bilateral airspace opacities R>L consistent with edema, PNA, or ARDS. She is admitted to the intensive care service, and cardiology is consulted for further recommendations.  On my  evaluation, the patient is intubated and sedated. She is unable to participate in history.   Heart Pathway Score:     Past Medical History:  Diagnosis Date  . Acute gouty arthritis   . Anemia   . Arthritis   . Bilateral edema of lower extremity   . Cancer ([place])    cervical cancer, had partial hysterectomy  . CHF (congestive heart failure) ([place])   . Chronic combined systolic and diastolic heart failure ([place])   . COPD (chronic obstructive pulmonary disease) ([place])   . Dilated cardiomyopathy ([place])   . Gastroesophageal reflux disease   . Gout   . Headache    sinus headaches  . Heart murmur    when she was younger, never had any problems  . Hypertension   . LBBB (left bundle branch block)   . Morbid obesity with alveolar hypoventilation ([place])   . Pneumonia   . Sleep apnea    uses cpap    Past Surgical History:  Procedure Laterality Date  . CARDIAC CATHETERIZATION N/A 12/14/2014   Procedure: Right/Left Heart Cath and Coronary Angiography;  Surgeon: Melody Prows, MD;  Location: [facility];  Service: Cardiovascular;  Laterality: N/A;  . COLONOSCOPY WITH PROPOFOL N/A 08/12/2015   Procedure: COLONOSCOPY WITH PROPOFOL;  Surgeon: Melody Horner, MD;  Location[facility] END[facility]COPY;  Service: Endoscopy;  Laterality: N/A;  . PARTIAL HYSTERECTOMY  1979   abdominal, cancer  . TUBAL LIGATION       Home Medications:  Prior to Admission medications   Medication Sig Start Date End Date Taking? Authorizing Provider  acetaminophen-codeine (TYLENOL #3)  300-30 MG tablet Take 1-2 tablets by mouth every 8 (eight) hours as needed for moderate pain. Patient not taking: Reported on 03/02/2019 09/09/17   Melody Rossetti, MD  albuterol (PROVENTIL HFA;VENTOLIN HFA) 108 (90 BASE) MCG/ACT inhaler Inhale 1-2 puffs into the lungs every 6 (six) hours as needed for wheezing or shortness of breath.    [provider]  allopurinol (ZYLOPRIM) 100 MG tablet Take 100 mg by mouth daily.    [provider]  aspirin EC 81 MG tablet Take 81 mg by mouth daily.    [provider]  carvedilol (COREG) 25 MG tablet Take 37.5 mg by mouth 2 (two) times daily with a meal.    [provider]  doxycycline (VIBRAMYCIN) 100 MG capsule Take 1 capsule (100 mg total) by mouth 2 (two) times daily. One po bid x 7 days 03/02/19   Melody Rice, MD  furosemide (LASIX) 40 MG tablet Take 40 mg by mouth daily.    [provider]  Multiple Vitamins-Minerals (CENTRUM SILVER ULTRA WOMENS) TABS Take 1 tablet by mouth daily.    [provider]  omeprazole (PRILOSEC) 20 MG capsule Take 20 mg by mouth daily.    [provider]  ondansetron (ZOFRAN ODT) 4 MG disintegrating tablet 4mg  ODT q4 hours prn nausea/vomit 03/02/19   Melody Rice, MD  sacubitril-valsartan (ENTRESTO) 97-103 MG Take 1 tablet by mouth 2 (two) times daily.    [provider]  traMADol (ULTRAM) 50 MG tablet Take 1-2 tablets (50-100 mg total) by mouth every 6 (six) hours as needed. Patient not taking: Reported on 03/02/2019 11/26/17   Melody Rossetti, MD  traMADol HCl 100 MG TABS Take 1 tablet by mouth every 8 (eight) hours as needed (pain).  01/26/19   [provider]    Inpatient Medications: Scheduled Meds: . aspirin EC  81 mg Oral Daily  . dexamethasone (DECADRON) injection  6 mg Intravenous Q24H  . fentaNYL (SUBLIMAZE) injection  25 mcg Intravenous Once  . heparin  5,000 Units Subcutaneous Q8H  . ipratropium-albuterol  3 mL Nebulization Q6H  . midazolam      . pantoprazole (PROTONIX) IV  40 mg Intravenous QHS   Continuous Infusions: . epinephrine 0.5 mcg/min (08/02/19 0033)  . fentaNYL infusion INTRAVENOUS    . [START ON 08/03/2019] levofloxacin (LEVAQUIN) IV    . remdesivir 200 mg in sodium chloride 0.9% 250 mL IVPB     Followed by  . [START ON 08/03/2019] remdesivir 100 mg in NS 100 mL     PRN Meds: fentaNYL  Allergies:    Allergies  Allergen Reactions  .  Penicillins Anaphylaxis and Swelling    Has patient had a PCN reaction causing immediate rash, facial/tongue/throat swelling, SOB or lightheadedness with hypotension: Yes Has patient had a PCN reaction causing severe rash involving mucus membranes or skin necrosis: No Has patient had a PCN reaction that required hospitalization Yes Has patient had a PCN reaction occurring within the last 10 years: No If all of the above answers are "NO", then may proceed with Cephalosporin use.   . Sulfa Antibiotics Anaphylaxis and Swelling    Social History:   Social History   Socioeconomic History  . Marital status: Widowed    Spouse name: Not on file  . Number of children: Not on file  . Years of education: Not on file  . Highest education level: Not on file  Occupational History  . Not on file  Tobacco Use  .  Smoking status: Former Smoker    Quit date: 05/17/1999    Years since quitting: 20.2  . Smokeless tobacco: Never Used  . Tobacco comment: patient states she smoked before, quit in the 2000s  Substance and Sexual Activity  . Alcohol use: No    Alcohol/week: 0.0 standard drinks  . Drug use: No  . Sexual activity: Not on file  Other Topics Concern  . Not on file  Social History Narrative   ** Merged History Encounter **       Social Determinants of Health   Financial Resource Strain:   . Difficulty of Paying Living Expenses: Not on file  Food Insecurity:   . Worried About Charity fundraiser in the Last Year: Not on file  . Ran Out of Food in the Last Year: Not on file  Transportation Needs:   . Lack of Transportation (Medical): Not on file  . Lack of Transportation (Non-Medical): Not on file  Physical Activity:   . Days of Exercise per Week: Not on file  . Minutes of Exercise per Session: Not on file  Stress:   . Feeling of Stress : Not on file  Social Connections:   . Frequency of Communication with Friends and Family: Not on file  . Frequency of Social Gatherings with  Friends and Family: Not on file  . Attends Religious Services: Not on file  . Active Member of Clubs or Organizations: Not on file  . Attends Archivist Meetings: Not on file  . Marital Status: Not on file  Intimate Partner Violence:   . Fear of Current or Ex-Partner: Not on file  . Emotionally Abused: Not on file  . Physically Abused: Not on file  . Sexually Abused: Not on file    Family History:     Family History  Problem Relation Age of Onset  . Hypertension Mother   . Pneumonia Mother   . Healthy Father   . Diabetes Other   . Asthma Other   . Colon cancer Daughter   . Healthy Son        X3  . Healthy Daughter        x2     ROS:  Unable to complete ROS as pt is intubated and sedated.   Physical Exam/Data:   Vitals:   08/02/19 0009 08/02/19 0012 08/02/19 0015 08/02/19 0017  BP: (!) 189/101 (!) 184/99 (!) 167/88   Pulse:   72   Resp: (!) 22 19 16    Temp:    (!) 96.5 F (35.8 C)  TempSrc:    Temporal  SpO2:   100%   Weight:   (!) 149.7 kg   Height:   5\' 8"  (1.727 m)    No intake or output data in the 24 hours ending 08/02/19 0350 Last 3 Weights 08/02/2019 09/09/2017 08/12/2015  Weight (lbs) 330 lb 325 lb 325 lb  Weight (kg) 149.687 kg 147.419 kg 147.419 kg     Body mass index is 50.18 kg/m.  General:  Chronically ill appearing. Intubated and sedated HEENT: ETT in airway  Neck: Unable to assess JVD given habitus  Cardiac:  normal S1, S2; RRR; no murmur   Lungs:  Mechanically ventilated with coarse breath sounds  Abd: soft, nontender  Ext: Diffusely large leges without pitting edema Musculoskeletal:  No deformities  Skin: warm and dry  Neuro/Psych:  Jerking intermittently. Otherwise difficult to assess as pt is intubated and sedated no focal abnormalities noted  EKG:  The EKG was personally reviewed and demonstrates: Sinus rhythm with LBBB Telemetry:  Telemetry was personally reviewed and demonstrates:  Sinus rhythm with occasional PVCs  Relevant  CV Studies:  LHC 12/2014:  Ost LAD lesion, 30% stenosed.  Severely tortuous coronary vessels, LVEF 20%, findings consistent with dilated nonischemic cardiomyopathy. Moderate pulmonary hypertension, normal LVEDP. Increase cardiac output and index due to morbid obesity  Laboratory Data:  High Sensitivity Troponin:   Recent Labs  Lab 08/02/19 0005  TROPONINIHS 29*     Chemistry Recent Labs  Lab 08/02/19 0005 08/02/19 0044  NA 139 140  K 4.8 4.6  CL 106  --   CO2 17*  --   GLUCOSE 167*  --   BUN 14  --   CREATININE 1.86*  --   CALCIUM 8.0*  --   GFRNONAA 26*  --   GFRAA 30*  --   ANIONGAP 16*  --     Recent Labs  Lab 08/02/19 0005  PROT 6.5  ALBUMIN 2.4*  AST 105*  ALT 54*  ALKPHOS 104  BILITOT 0.5   Hematology Recent Labs  Lab 08/02/19 0005 08/02/19 0044  WBC 22.1*  --   RBC 3.18*  --   HGB 9.9* 10.9*  HCT 33.3* 32.0*  MCV 104.7*  --   MCH 31.1  --   MCHC 29.7*  --   RDW 13.5  --   PLT 97*  --    BNPNo results for input(s): BNP, PROBNP in the last 168 hours.  DDimer No results for input(s): DDIMER in the last 168 hours.   Radiology/Studies:  DG Chest Portable 1 View  Result Date: 08/02/2019 CLINICAL DATA:  Tube placement EXAM: PORTABLE CHEST 1 VIEW COMPARISON:  Radiograph March 02, 2019 FINDINGS: *Endotracheal tube in the mid trachea, 4.2 cm from the carina. *Transesophageal tube tip and side port below the GE junction, beyond the level of imaging. *Multiple telemetry leads and monitoring devices overlie the chest. *Defibrillator pad over the left chest wall. Patchy opacities are present throughout both lungs more focally in the right upper lobe and infrahilar lung. No visible pneumothorax or effusion. Cardiac silhouette is borderline enlarged though may be accentuated by portable supine technique. The aorta is calcified. The remaining cardiomediastinal contours are unremarkable. No acute osseous or soft tissue abnormality. IMPRESSION: Satisfactory  positioning of lines and tubes. Bilateral airspace opacities, right greater than left, could reflect edema, pneumonia or ARDS. Electronically Signed   By: Lovena Le M.D.   On: 08/02/2019 00:36         Assessment and Plan:   S/P out-of-hospital cardiac arrest  The patient is brought to the ED after out-of-hospital cardiac arrest. Her predominant rhythm was reportedly PEA or asystole, although by some reports she had VT with shock and initiation of amidodarone. The cause of her arrest is not clear, but primary pulmonary insult (COVID, aspiration, etc) has been suspected. She has LBBB on ECG that has been reported previously, and troponin is minimally elevated. Her presentation is therefore unlikely to be primary cardiac insult leading to arrest. She is undergoing further workup and treatment by the critical care service, who has made the decision not to pursue TTM. At this time, ongoing treatment and workup of her the triggers provoking her arrest is appropriate. Cardiac monitoring with repeat workup is reasonable. -Continue workup/treatement by critical care team -Continue to monitor on telemetry -Continue to trend troponin  -Can defer further amiodarone unless he has recurrent ventricular arrhythmias -Agree with repeat  echocardiogram -Can consider repeat ischemic evaluation based on clinical trajectory   HFrEF The patient has known non-ischemic cardiomyopathy based on remote TTE and LHC. She has been treated with carvedilol and Entresto, although she has no recent cardiology assessment per available records.  -Echocardiogram as above -Hold home HF medications in the setting of acute illness. Can restart once clinically stable  Non-obstructive CAD LHC in 2016 showed only mild CAD. As above, it does not appear that her arrest is due to a primary cardiac cause. -Continue to trend troponin as above -Continue ASA -Would likely benefit from statin -Lipid panel ordered       For questions  or updates, please contact [facility] Please consult [email or web address] for contact info under     Signed, Nila Nephew, MD  08/02/2019 3:50 AM

## 2019-08-02 NOTE — Progress Notes (Signed)
Echocardiogram 2D Echocardiogram has been performed.  Melody Flores 08/02/2019, 3:41 PM

## 2019-08-02 NOTE — Progress Notes (Signed)
Granddaughter called at this time to inform her of patients admission to [facility]. All questions answered and [name] voiced gratitude for care being provided to her grandmother.

## 2019-08-02 NOTE — Progress Notes (Signed)
Pharmacy Antibiotic Note  Melody Flores is a 74 y.o. female admitted on 07/23/2019 with pneumonia.  Pharmacy has been consulted for Levaquin dosing. S/P cardiac arrest with ROSC. WBC is elevated. Noted renal dysfunction.   Plan: Levaquin 750 mg IV q48h Trend WBC, temp, renal function  F/U infectious work-up  Height: 5\' 8"  (172.7 cm) Weight: (!) 330 lb (149.7 kg) IBW/kg (Calculated) : 63.9  Temp (24hrs), Avg:96.5 F (35.8 C), Min:96.5 F (35.8 C), Max:96.5 F (35.8 C)  Recent Labs  Lab 08/02/19 0005  WBC 22.1*  CREATININE 1.86*  LATICACIDVEN 8.9*    Estimated Creatinine Clearance: 41.1 mL/min (A) (by C-G formula based on SCr of 1.86 mg/dL (H)).    Allergies  Allergen Reactions  . Penicillins Anaphylaxis and Swelling    Has patient had a PCN reaction causing immediate rash, facial/tongue/throat swelling, SOB or lightheadedness with hypotension: Yes Has patient had a PCN reaction causing severe rash involving mucus membranes or skin necrosis: No Has patient had a PCN reaction that required hospitalization Yes Has patient had a PCN reaction occurring within the last 10 years: No If all of the above answers are "NO", then may proceed with Cephalosporin use.   [name] Antibiotics Anaphylaxis and Swelling   Narda Bonds, PharmD, BCPS Clinical Pharmacist Phone: [phone]

## 2019-08-02 NOTE — Consult Note (Signed)
NEURO HOSPITALIST CONSULT NOTE   Requesting physician: Dr. Nelda Marseille   Reason for Consult: Status post cardiac arrest, questionable myoclonic jerking, poor neurological exam-prognostication   History obtained from:  Chart review and family HPI:                                                                                                                                          Melody Flores is an 74 y.o. female with PMH dilated cardiomyopathy,(EF 20-25%) on Entresto with improvement in the ejection fraction, moderately dilated LA and mild MR in 2018 with a cardiac cath that showed mild nonobstructive CAD, OSA, COPD, HTN , cervical cancer ( s/p partial hysterectomy)and obesity presented to [facility] s/p cardiac arrest x3.  Per granddaughter patient lives at home with 3 other people. ( all of which have tested negative for Covid in the past 2 weeks) was not sick and had not been c/o of any illness.  About 2 days ago she had a HA that was relieved with sinus medication and had been fine every since. Yesterday, she was her normal self. She sat up on the side of the bed and c/o difficulty breathing. She used her inhaler that did not make her breathing better. She told family to call EMS because the inhaler was not working. When Ems arrived patient was not breathing and CPR was started.  Per the chart review patient had witnessed PEA arrest/vtach arrest with ROSC x 3.  Total downtime prior to final ROSC upwards of 30 minutes. Not started on hypothermia protocol. Neurology consultation obtained this afternoon after primary team continue to see no improvement in exam even after holding sedation and there was some myoclonic jerking of the face jaw and whole body myoclonic jerks after holding sedation.  Hospital course:  07/30/2019: admitted s/p cardiac arrest x3 with ROSC Cxr: bilateral airspace opacities, Wbc 22, lactic acid 8.9 creatinine 1.8, COVID -19 +, UDS negative, ETOH: <  10 1/31: neurology consulted Past Medical History:  Diagnosis Date  . Acute gouty arthritis   . Anemia   . Arthritis   . Bilateral edema of lower extremity   . Cancer ([place])    cervical cancer, had partial hysterectomy  . CHF (congestive heart failure) ([place])   . Chronic combined systolic and diastolic heart failure ([place])   . COPD (chronic obstructive pulmonary disease) ([place])   . Dilated cardiomyopathy ([place])   . Gastroesophageal reflux disease   . Gout   . Headache    sinus headaches  . Heart murmur    when she was younger, never had any problems  . Hypertension   . LBBB (left bundle branch block)   . Morbid obesity with alveolar hypoventilation ([place])   . Pneumonia   . Sleep apnea  uses cpap    Past Surgical History:  Procedure Laterality Date  . CARDIAC CATHETERIZATION N/A 12/14/2014   Procedure: Right/Left Heart Cath and Coronary Angiography;  Surgeon: Adrian Prows, MD;  Location: [facility];  Service: Cardiovascular;  Laterality: N/A;  . COLONOSCOPY WITH PROPOFOL N/A 08/12/2015   Procedure: COLONOSCOPY WITH PROPOFOL;  Surgeon: Wonda Horner, MD;  Location: [facility];  Service: Endoscopy;  Laterality: N/A;  . PARTIAL HYSTERECTOMY  1979   abdominal, cancer  . TUBAL LIGATION      Family History  Problem Relation Age of Onset  . Hypertension Mother   . Pneumonia Mother   . Healthy Father   . Diabetes Other   . Asthma Other   . Colon cancer Daughter   . Healthy Son        X3  . Healthy Daughter        x2          Social History:  reports that she quit smoking about 20 years ago. She has never used smokeless tobacco. She reports that she does not drink alcohol or use drugs.  Allergies  Allergen Reactions  . Penicillins Anaphylaxis and Swelling    Has patient had a PCN reaction causing immediate rash, facial/tongue/throat swelling, SOB or lightheadedness with hypotension: Yes Has patient had a PCN reaction causing severe rash involving mucus membranes or  skin necrosis: No Has patient had a PCN reaction that required hospitalization Yes Has patient had a PCN reaction occurring within the last 10 years: No If all of the above answers are "NO", then may proceed with Cephalosporin use.   . Sulfa Antibiotics Anaphylaxis and Swelling    MEDICATIONS:                                                                                                                     Scheduled: . chlorhexidine  15 mL Mouth Rinse BID  . Chlorhexidine Gluconate Cloth  6 each Topical Daily  . dexamethasone (DECADRON) injection  6 mg Intravenous Q24H  . heparin  5,000 Units Subcutaneous Q8H  . ipratropium-albuterol  3 mL Nebulization Q6H  . mouth rinse  15 mL Mouth Rinse q12n4p  . midazolam      . pantoprazole (PROTONIX) IV  40 mg Intravenous QHS   Continuous: . epinephrine Stopped (08/02/19 0430)  . [START ON 08/03/2019] levofloxacin (LEVAQUIN) IV    . [START ON 08/03/2019] remdesivir 100 mg in NS 100 mL     [id number] (TYLENOL) oral liquid 160 mg/5 mL   ROS:  unobtainable from patient due to mental status and intubation   Blood pressure 125/68, pulse 73, temperature (!) 100.8 F (38.2 C), resp. rate (!) 29, height 5\' 8"  (1.727 m), weight (!) 149.7 kg, SpO2 98 %.   General Examination:                                                                                                      General exam: Morbidly obese, at this time on no sedation, intubated HEENT: Normocephalic atraumatic CVS: Regular rate rhythm Respiratory: Vented Abdomen: Obese Extremities: Bilateral edema Neurological exam Propofol was held for about 30 minutes prior to this examination She is intubated She does not respond to voice or noxious stimulation She is breathing with the ventilator Her pupils are 2 mm nonreactive. She is breathing with  the ventilator.  No cough or gag. Cranial nerves: Pupils 2 mm nonreactive, unable to elicit oculocephalics, breathing with the ventilator, no cough or gag, difficult ascertain facial symmetry. Motor exam: No spontaneous movement and no withdrawal to noxious stimulation.  On noxious stimulation on a few occasions there was a induction of myoclonus looking movements. Also observed was some myoclonus in the face and jaw muscles. Sensory exam: As above Coordination cannot be tested   Lab Results: Basic Metabolic Panel: Recent Labs  Lab 08/02/19 0005 08/02/19 0044 08/02/19 0521 08/02/19 0524  NA 139 140 140 140  K 4.8 4.6 4.0 3.9  CL 106  --  106  --   CO2 17*  --  22  --   GLUCOSE 167*  --  163*  --   BUN 14  --  18  --   CREATININE 1.86*  --  2.05*  --   CALCIUM 8.0*  --  8.3*  --   MG  --   --  1.9  --     CBC: Recent Labs  Lab 08/02/19 0005 08/02/19 0044 08/02/19 0521 08/02/19 0524  WBC 22.1*  --  11.3*  --   NEUTROABS 15.1*  --   --   --   HGB 9.9* 10.9* 11.2* 12.2  HCT 33.3* 32.0* 36.0 36.0  MCV 104.7*  --  98.1  --   PLT 97*  --  137*  --    Lipid Panel: Recent Labs  Lab 08/02/19 0521  CHOL 136  TRIG 66  HDL 43  CHOLHDL 3.2  VLDL 13  LDLCALC 80    Imaging: DG Chest Portable 1 View  Result Date: 08/02/2019 CLINICAL DATA:  Tube placement EXAM: PORTABLE CHEST 1 VIEW COMPARISON:  Radiograph March 02, 2019 FINDINGS: *Endotracheal tube in the mid trachea, 4.2 cm from the carina. *Transesophageal tube tip and side port below the GE junction, beyond the level of imaging. *Multiple telemetry leads and monitoring devices overlie the chest. *Defibrillator pad over the left chest wall. Patchy opacities are present throughout both lungs more focally in the right upper lobe and infrahilar lung. No visible pneumothorax or effusion. Cardiac silhouette is borderline enlarged though may be accentuated by portable supine technique. The aorta is calcified. The remaining  cardiomediastinal contours are unremarkable. No acute osseous or soft tissue abnormality. IMPRESSION: Satisfactory positioning of lines and tubes. Bilateral airspace opacities, right greater than left, could reflect edema, pneumonia or ARDS. Electronically Signed   By: Lovena Le M.D.   On: 08/02/2019 00:36   No brain imaging from this admission available for review Laurey Morale, MSN, NP-C [facility] [phone]  Attending neurologist's note to follow  Attending addendum Patient seen and examined in the ICU at [facility] ICU. Patient found to be Covid positive.  Brought in after initially being seen by EMS for shortness of breath and had witnessed cardiac arrest x3 with downtime prior to final ROSC about 30 minutes.  Not deemed a candidate for hypothermia protocol.  Upon reducing sedation, exhibited whole body myoclonic jerking.  Was on sedation with propofol. I have documented my exam in the examination section in the top part of the note. Patient unable to provide review of systems. No brain imaging from this admission available.  Assessment:  74 year old woman with dilated cardiomyopathy, moderate dilated LA, nonobstructive CAD, OSA, COPD, hypertension, cervical cancer brought in with witnessed cardiac arrest x3 requiring CPR and total downtime of about 30 minutes prior to ROSC. Patient had been complaining of a headache for couple of days and initially was seen by EMS due to shortness of breath. Unclear how long she had been short of breath prior to presentation. She tested to be Covid positive and has been placed in the Covid ICU after intubation. She started exhibiting whole body myoclonic jerking after propofol was held for exam. Given the history, poor neurological exam with no cortical reflexes and even brainstem reflexes, prolonged downtime with cardiac arrest, poor cardiac history, although it may be too early to prognosticate, it appears that she is  someone who would have a less chance for an good neurological recovery.  Impression: -Evaluate for hypoxic/anoxic brain injury status post cardiac arrest -Myoclonic jerking-evaluate for possible underlying myoclonic status epilepticus versus status epilepticus post anoxic injury/cardiac arrest -Severe toxic metabolic encephalopathy-deranged renal and liver function.  Recommendations: CTH stat Hook up to LTM EEG Resume propofol Load with Keppra 1 g IV now and continue Keppra 500 twice daily If she continues to have myoclonic jerking, consider adding Klonopin.  I am hesitant to add Depakote due to her deranged liver function. Consider MRI in the next 24 hours or so.  For now would watch on LTM EEG. That would also give [place] some idea of what her brain function looks like. Clinically, presence of myoclonus such early in the course of post cardiac arrest usually portends poor prognosis for neurologically meaningful recovery.  We will continue to follow with you.  -- Amie Portland, MD [facility] Pager: [phone] If 7pm to 7am, please call on call as listed on AMION.  CRITICAL CARE ATTESTATION Performed by: Amie Portland, MD Total critical care time: 40 minutes Critical care time was exclusive of separately billable procedures and treating other patients and/or supervising APPs/Residents/Students Critical care was necessary to treat or prevent imminent or life-threatening deterioration due to post cardiac arrest hypoxic/anoxic brain injury, seizure versus myoclonus.  Toxic metabolic encephalopathy. This patient is critically ill and at significant risk for neurological worsening and/or death and care requires constant monitoring. Critical care was time spent personally by me on the following activities: development of treatment plan with patient and/or surrogate as well as nursing, discussions with consultants, evaluation of patient's response to treatment, examination of patient,  obtaining history from patient  or surrogate, ordering and performing treatments and interventions, ordering and review of laboratory studies, ordering and review of radiographic studies, pulse oximetry, re-evaluation of patient's condition, participation in multidisciplinary rounds and medical decision making of high complexity in the care of this patient.

## 2019-08-02 NOTE — Progress Notes (Signed)
NAME:  Melody Flores, MRN:  [id number], DOB:  1946/03/05, LOS: 0 ADMISSION DATE:  07/25/2019, CONSULTATION DATE:  08/02/19 REFERRING MD:  Roxanne Mins, CHIEF COMPLAINT:  Cardiac arrest   Brief History   74 y.o. F with PMH of COPD and dilated cardiomyopathy who complained of shortness of breath yesterday.  Family called EMS and pt had a witnessed PEA/Vtach arrest with ROSC three times.  Intubated in the ED with infiltrates on CXR.  Covid-19 positive  History of present illness   74 y.o. F with PMH of dilated cardiomyopathy with EF of 20-25%, obesity and OSA, COPD, HTN and obesity who complained of shortness of breath today.  Family called EMS and patient had witnessed PEA arrest with ROSC x2, at some point was in [place] and given amiodarone and defibrillated.  She then loss of pulses again with CPR in progress on arrival.  Per report, pt received total of 8mg  Epinephrine and then placed on Epi gtt.   Work-up in the ED significant for bilateral airspace opacities on CXR, WBC 22k, lactic acid 8.9, Creatinine 1.8, LBBB on EKG of unclear chronicity with troponin of 29.   Blood pressure was stable and patient was given levaquin.  Covid-19 test resulted positive.  Spoke with patient's granddaughter [name] who lives with her, she notes several family members with URI sx's recently, but had tested negative for Covid-19.  Past Medical History   has a past medical history of Acute gouty arthritis, Anemia, Arthritis, Bilateral edema of lower extremity, Cancer (HCC), CHF (congestive heart failure) (HCC), Chronic combined systolic and diastolic heart failure (HCC), COPD (chronic obstructive pulmonary disease) ([place]), Dilated cardiomyopathy (HCC), Gastroesophageal reflux disease, Gout, Headache, Heart murmur, Hypertension, LBBB (left bundle branch block), Morbid obesity with alveolar hypoventilation ([place]), Pneumonia, and Sleep apnea.   Significant Hospital Events   1/31 Admit to PCCM  Consults:   cardiology  Procedures:  1/31 ETT 1/31 R femoral CVC placed by EDP  Significant Diagnostic Tests:  1/31 CXR>>Bilateral airspace opacities, right greater than left, could reflect edema, pneumonia or ARDS. 1/31 CT head>> 1/31 CTA chest>>  Micro Data:  1/31 Sars-CoV-2>>positive 1/31 blood cultures x2>> 1/31 urine culture  Antimicrobials:  Levaquin 1/31- Remdesivir 1/31-  Interim history/subjective:  Hemodynamically more stable overnight Respiratory status improving from FiO2 standpoint Comatose  Objective   Blood pressure (!) 111/24, pulse 79, temperature (!) 100.9 F (38.3 C), resp. rate 18, height 5\' 8"  (1.727 m), weight (!) 149.7 kg, SpO2 99 %.    Vent Mode: PRVC FiO2 (%):  [40 %-60 %] 50 % Set Rate:  [12 bmp] 12 bmp Vt Set:  [530 mL] 530 mL PEEP:  [5 cmH20] 5 cmH20 Plateau Pressure:  [19 cmH20-25 cmH20] 25 cmH20   Intake/Output Summary (Last 24 hours) at 08/02/2019 1151 Last data filed at 08/02/2019 0900 Gross per 24 hour  Intake 437.24 ml  Output 100 ml  Net 337.24 ml   Filed Weights   08/02/19 0015  Weight: (!) 149.7 kg   General: Morbidly obese female intubated HEENT: MM pink/moist, ETT in place Neuro: Completely comatose, respiratory drive present and pupils are 2-3 mm and none reactive, otherwise completely flat CV: s1s2 RRR no m/r/g PULM: Bilateral rhonchi GI: soft, bsx4 active  Extremities: warm/dry, 1+ edema  Skin: no rashes or lesions  I reviewed CXR myself, ETT is in a good position with diffuse R>L infiltrate noted  Discussed with bedside RN and RT  Resolved Hospital Problem list     Assessment & Plan:  Cardiopulmonary arrest  likely secondary to acute Covid-19 infection and Pneumonia -Final ROSC greater than 30 minutes from initial PEA arrest, not a candidate for TTM -Cardiology consulted in the ED -Last echocardiogram 2016 with EF of 20 to 25% -L BBB on EKG of unclear chronicity with minimally elevated troponin P: - Started  remdesivir and dexamethasone at COVID doses - F/U on standard inflammatory labs - D/C CT of the chest for now given Cr and improvement in oxygenation - Trend troponin, cardiology was consulted and appreciate input - Maintain normothermia, no hypothermia started on admission appropriately given hemodynamics at the time and neurologic exam now - Hold home aspirin until head CT is done - Echo per cards - Follow blood cultures and continue Levaquin (anaphylaxis to penicillins) - Full vent support, increase PEEP to 8 and drop FiO2 to 50% - Titrate FiO2 for sat of 88-92% - HOB elevated 30 degrees. - Plateau pressures less than 30 cm H20.  - CXR and ABG in AM - Adjust vent for ABG - Bronchial hygiene and RT/bronchodilator protocol. - TF per nutrition  Acute encephalopathy -Received Versed for RSI -Alert and oriented prior to arrest per family, at risk for anoxic brain injury given prolonged code  P: - Head CT without contrast ordered STAT - Neurology consult called, did not feel MRI and EEG are necessary now, will order if deemed necessary when Dr. Rory Percy evaluates the patient - D/C all sedating medications, patient is completely comatose on exam  AGMA -Likely secondary to lactic acidosis P: - BMET in AM - Hold diureses - Replace electrolytes on exam - Maintain euvolemic at this time - KVO IVF  COPD -Not on home O2, no PFTs in system, no recent hospitalizations for exacerbation P: - Continue duo nebs and Levaquin  Dilated cardiomyopathy -Last EF in 2016 was 20-25% P: - Obtain echo, follow troponin - Restart aspirin in AM if head CT is negative - Hold home beta-blocker and Entresto in the setting of acute illness, resumption per cardiology recs - D/C epi drip, hemodynamically stable for now  AKI -Creatinine 1.8, was 1.45 months ago -Likely secondary to critical illness P: - Hold off chest CT for now - Monitor urine output and creatinine - Avoid nephrotoxins as  able      Best practice:  Diet: N.p.o. Pain/Anxiety/Delirium protocol (if indicated): Fentanyl VAP protocol (if indicated): Yes DVT prophylaxis: Heparin GI prophylaxis: Protonix Glucose control: SSI Mobility: Bedrest Code Status: Full code confirmed Family Communication: Patient's granddaughter [name] updated Disposition: ICU  Labs   CBC: Recent Labs  Lab 08/02/19 0005 08/02/19 0044 08/02/19 0521 08/02/19 0524  WBC 22.1*  --  11.3*  --   NEUTROABS 15.1*  --   --   --   HGB 9.9* 10.9* 11.2* 12.2  HCT 33.3* 32.0* 36.0 36.0  MCV 104.7*  --  98.1  --   PLT 97*  --  137*  --     Basic Metabolic Panel: Recent Labs  Lab 08/02/19 0005 08/02/19 0044 08/02/19 0521 08/02/19 0524  NA 139 140 140 140  K 4.8 4.6 4.0 3.9  CL 106  --  106  --   CO2 17*  --  22  --   GLUCOSE 167*  --  163*  --   BUN 14  --  18  --   CREATININE 1.86*  --  2.05*  --   CALCIUM 8.0*  --  8.3*  --   MG  --   --  1.9  --    GFR: Estimated Creatinine Clearance: 37.3 mL/min (A) (by C-G formula based on SCr of 2.05 mg/dL (H)). Recent Labs  Lab 08/02/19 0005 08/02/19 0521  WBC 22.1* 11.3*  LATICACIDVEN 8.9*  --     Liver Function Tests: Recent Labs  Lab 08/02/19 0005 08/02/19 0521  AST 105* 159*  ALT 54* 72*  ALKPHOS 104 120  BILITOT 0.5 1.0  PROT 6.5 7.2  ALBUMIN 2.4* 2.7*   No results for input(s): LIPASE, AMYLASE in the last 168 hours. No results for input(s): AMMONIA in the last 168 hours.  ABG    Component Value Date/Time   PHART 7.368 08/02/2019 0524   PCO2ART 38.9 08/02/2019 0524   PO2ART 65.0 (L) 08/02/2019 0524   HCO3 22.1 08/02/2019 0524   TCO2 23 08/02/2019 0524   ACIDBASEDEF 2.0 08/02/2019 0524   O2SAT 90.0 08/02/2019 0524     Coagulation Profile: Recent Labs  Lab 08/02/19 0005  INR 1.4*    Cardiac Enzymes: No results for input(s): CKTOTAL, CKMB, CKMBINDEX, TROPONINI in the last 168 hours.  HbA1C: Hgb A1c MFr Bld  Date/Time Value Ref Range Status   08/02/2019 05:21 AM 5.6 4.8 - 5.6 % Final    Comment:    (NOTE) Pre diabetes:          5.7%-6.4% Diabetes:              >6.4% Glycemic control for   <7.0% adults with diabetes     CBG: Recent Labs  Lab 08/02/19 0031 08/02/19 0842  GLUCAP 122* 123*    The patient is critically ill with multiple organ systems failure and requires high complexity decision making for assessment and support, frequent evaluation and titration of therapies, application of advanced monitoring technologies and extensive interpretation of multiple databases.   Critical Care Time devoted to patient care services described in this note is  60  Minutes. This time reflects time of care of this signee Dr Jennet Maduro. This critical care time does not reflect procedure time, or teaching time or supervisory time of PA/NP/Med student/Med Resident etc but could involve care discussion time.  Rush Farmer, M.D. [facility] Pulmonary/Critical Care Medicine.

## 2019-08-02 NOTE — Progress Notes (Signed)
74 y/o F with nonischemic cardiomyopathy with recovered EF, obesity, sleep apnea, COPD, admitted with out of hospital PEA cardiac arrest, followed by episodes of VT. COVID positive. EKG with LBBB. Trop HS minimally elevated. Lactic acid 8.9. Unlikely primary cardiac etiology.   Last seen in our office in Dec 2018. Was on Entresto 97-103 mg bid.   Echocardiogram 01/2017: EF 50-55%. Mod LA dilatation. Mild MR.  EF improved from 25-30% prior echo in 2016.   Cath 2016: Mild nonobstructive CAD  ACS unlikely to the etiology of her cardiac arrest.   Seen by [facility] cardiology earlier today. No additional recommendations at this time.  Appreciate PCCM care for this critically ill patient with COVID.  Nigel Mormon, MD [facility]. [place] Pager: [phone] Office: [phone]

## 2019-08-02 NOTE — H&P (Signed)
NAME:  Melody Flores, MRN:  [id number], DOB:  03-May-1946, LOS: 0 ADMISSION DATE:  07/11/2019, CONSULTATION DATE:  08/02/19 REFERRING MD:  Roxanne Mins, CHIEF COMPLAINT:  Cardiac arrest   Brief History   74 y.o. F with PMH of COPD and dilated cardiomyopathy who complained of shortness of breath yesterday.  Family called EMS and pt had a witnessed PEA/Vtach arrest with ROSC three times.  Intubated in the ED with infiltrates on CXR.  Covid-19 positive  History of present illness   74 y.o. F with PMH of dilated cardiomyopathy with EF of 20-25%, obesity and OSA, COPD, HTN and obesity who complained of shortness of breath today.  Family called EMS and patient had witnessed PEA arrest with ROSC x2, at some point was in [place] and given amiodarone and defibrillated.  She then loss of pulses again with CPR in progress on arrival.  Per report, pt received total of 8mg  Epinephrine and then placed on Epi gtt.   Work-up in the ED significant for bilateral airspace opacities on CXR, WBC 22k, lactic acid 8.9, Creatinine 1.8, LBBB on EKG of unclear chronicity with troponin of 29.   Blood pressure was stable and patient was given levaquin.  Covid-19 test resulted positive.  Spoke with patient's granddaughter Melody Flores who lives with her, she notes several family members with URI sx's recently, but had tested negative for Covid-19.  Past Medical History   has a past medical history of Acute gouty arthritis, Anemia, Arthritis, Bilateral edema of lower extremity, Cancer (HCC), CHF (congestive heart failure) (HCC), Chronic combined systolic and diastolic heart failure (HCC), COPD (chronic obstructive pulmonary disease) ([place]), Dilated cardiomyopathy (HCC), Gastroesophageal reflux disease, Gout, Headache, Heart murmur, Hypertension, LBBB (left bundle branch block), Morbid obesity with alveolar hypoventilation ([place]), Pneumonia, and Sleep apnea.   Significant Hospital Events   1/31 Admit to PCCM  Consults:   cardiology  Procedures:  1/31 ETT 1/31 R femoral CVC placed by EDP  Significant Diagnostic Tests:  1/31 CXR>>Bilateral airspace opacities, right greater than left, could reflect edema, pneumonia or ARDS. 1/31 CT head>> 1/31 CTA chest>>  Micro Data:  1/31 Sars-CoV-2>>positive 1/31 blood cultures x2>> 1/31 urine culture  Antimicrobials:  Levaquin 1/31- Remdesivir 1/31-  Interim history/subjective:  Patient relatively stable on epi gtt. after ROSC achieved for the third time and intubation in the ED.  Objective   Blood pressure (!) 167/88, pulse 72, temperature (!) 96.5 F (35.8 C), temperature source Temporal, resp. rate 16, height 5\' 8"  (1.727 m), weight (!) 149.7 kg, SpO2 100 %.       No intake or output data in the 24 hours ending 08/02/19 0127 Filed Weights   08/02/19 0015  Weight: (!) 149.7 kg    General: Morbidly obese female intubated HEENT: MM pink/moist, ETT in place Neuro: Withdraws to pain, not following commands, pupils equal and reactive to light CV: s1s2 RRR no m/r/g PULM: Bilateral rhonchi GI: soft, bsx4 active  Extremities: warm/dry, 1+ edema  Skin: no rashes or lesions   Resolved Hospital Problem list     Assessment & Plan:   Cardiopulmonary arrest  likely secondary to acute Covid-19 infection and Pneumonia -Final ROSC greater than 30 minutes from initial PEA arrest, not a candidate for TTM -Cardiology consulted in the ED -Last echocardiogram 2016 with EF of 20 to 25% -L BBB on EKG of unclear chronicity with minimally elevated troponin P: -Treat COVID-19 with dexamethasone and remdesivir -Check standard inflammatory labs -CT chest to evaluate for PE -Trend troponin, cardiology  was consulted and will evaluate -Maintain normothermia -Continue home aspirin -Obtain echocardiogram -Follow blood cultures and continue Levaquin (anaphylaxis to penicillins) --Maintain full vent support with SAT/SBT as tolerated -titrate Vent setting to maintain  SpO2 greater than or equal to 90%. -HOB elevated 30 degrees. -Plateau pressures less than 30 cm H20.  -Follow chest x-ray, ABG prn.   -Bronchial hygiene and RT/bronchodilator protocol.    Acute encephalopathy -Received Versed for RSI -Alert and oriented prior to arrest per family, at risk for anoxic brain injury given prolonged code  P: -Stat CT head, monitor neurologic recovery   AGMA -Likely secondary to lactic acidosis -pH 7.25 P: -Follow metabolic panel and trend lactic acid which is likely elevated secondary to CPR -Appears fairly euvolemic, hold IV fluids for right now   COPD -Not on home O2, no PFTs in system, no recent hospitalizations for exacerbation P: -Continue duo nebs and Levaquin  Dilated cardiomyopathy -Last EF in 2016 was 20-25% P: -Obtain echo, follow troponin -Continue aspirin -Hold home beta-blocker and Entresto in the setting of acute illness, resumption per cardiology recs -Continue low-dose epi gtt. for now  AKI -Creatinine 1.8, was 1.45 months ago -Likely secondary to critical illness P: -CT chest with contrast obtained as felt that benefit of identifying PE outweighed risk of contrast -Monitor urine output and creatinine -Avoid nephrotoxins as able      Best practice:  Diet: N.p.o. Pain/Anxiety/Delirium protocol (if indicated): Fentanyl VAP protocol (if indicated): Yes DVT prophylaxis: Heparin GI prophylaxis: Protonix Glucose control: SSI Mobility: Bedrest Code Status: Full code confirmed Family Communication: Patient's granddaughter Melody Flores updated Disposition: ICU  Labs   CBC: Recent Labs  Lab 08/02/19 0005 08/02/19 0044  WBC 22.1*  --   NEUTROABS 15.1*  --   HGB 9.9* 10.9*  HCT 33.3* 32.0*  MCV 104.7*  --   PLT 97*  --     Basic Metabolic Panel: Recent Labs  Lab 08/02/19 0005 08/02/19 0044  NA 139 140  K 4.8 4.6  CL 106  --   CO2 17*  --   GLUCOSE 167*  --   BUN 14  --   CREATININE 1.86*  --   CALCIUM 8.0*   --    GFR: Estimated Creatinine Clearance: 41.1 mL/min (A) (by C-G formula based on SCr of 1.86 mg/dL (H)). Recent Labs  Lab 08/02/19 0005  WBC 22.1*  LATICACIDVEN 8.9*    Liver Function Tests: Recent Labs  Lab 08/02/19 0005  AST 105*  ALT 54*  ALKPHOS 104  BILITOT 0.5  PROT 6.5  ALBUMIN 2.4*   No results for input(s): LIPASE, AMYLASE in the last 168 hours. No results for input(s): AMMONIA in the last 168 hours.  ABG    Component Value Date/Time   PHART 7.333 (L) 12/14/2014 1343   PCO2ART 48.6 (H) 12/14/2014 1343   PO2ART 95.0 12/14/2014 1343   HCO3 20.3 08/02/2019 0044   TCO2 22 08/02/2019 0044   ACIDBASEDEF 7.0 (H) 08/02/2019 0044   O2SAT 100.0 08/02/2019 0044     Coagulation Profile: Recent Labs  Lab 08/02/19 0005  INR 1.4*    Cardiac Enzymes: No results for input(s): CKTOTAL, CKMB, CKMBINDEX, TROPONINI in the last 168 hours.  HbA1C: No results found for: HGBA1C  CBG: Recent Labs  Lab 08/02/19 0031  GLUCAP 122*    Review of Systems:   Unable to obtain secondary to intubation  Past Medical History  She,  has a past medical history of Acute gouty arthritis, Anemia, Arthritis,  Bilateral edema of lower extremity, Cancer (HCC), CHF (congestive heart failure) (HCC), Chronic combined systolic and diastolic heart failure (HCC), COPD (chronic obstructive pulmonary disease) ([place]), Dilated cardiomyopathy (HCC), Gastroesophageal reflux disease, Gout, Headache, Heart murmur, Hypertension, LBBB (left bundle branch block), Morbid obesity with alveolar hypoventilation ([place]), Pneumonia, and Sleep apnea.   Surgical History    Past Surgical History:  Procedure Laterality Date  . CARDIAC CATHETERIZATION N/A 12/14/2014   Procedure: Right/Left Heart Cath and Coronary Angiography;  Surgeon: Adrian Prows, MD;  Location: [place] [facility];  Service: Cardiovascular;  Laterality: N/A;  . COLONOSCOPY WITH PROPOFOL N/A 08/12/2015   Procedure: COLONOSCOPY WITH PROPOFOL;   Surgeon: Wonda Horner, MD;  Location: [facility] ENDOSCOPY;  Service: Endoscopy;  Laterality: N/A;  . PARTIAL HYSTERECTOMY  1979   abdominal, cancer  . TUBAL LIGATION       Social History   reports that she quit smoking about 20 years ago. She has never used smokeless tobacco. She reports that she does not drink alcohol or use drugs.   Family History   Her family history includes Asthma in an other family member; Colon cancer in her daughter; Diabetes in an other family member; Healthy in her daughter, father, and son; Hypertension in her mother; Pneumonia in her mother.   Allergies Allergies  Allergen Reactions  . Penicillins Anaphylaxis and Swelling    Has patient had a PCN reaction causing immediate rash, facial/tongue/throat swelling, SOB or lightheadedness with hypotension: Yes Has patient had a PCN reaction causing severe rash involving mucus membranes or skin necrosis: No Has patient had a PCN reaction that required hospitalization Yes Has patient had a PCN reaction occurring within the last 10 years: No If all of the above answers are "NO", then may proceed with Cephalosporin use.   . Sulfa Antibiotics Anaphylaxis and Swelling     Home Medications  Prior to Admission medications   Medication Sig Start Date End Date Taking? Authorizing Provider  acetaminophen-codeine (TYLENOL #3) 300-30 MG tablet Take 1-2 tablets by mouth every 8 (eight) hours as needed for moderate pain. Patient not taking: Reported on 03/02/2019 09/09/17   Mcarthur Rossetti, MD  albuterol (PROVENTIL HFA;VENTOLIN HFA) 108 (90 BASE) MCG/ACT inhaler Inhale 1-2 puffs into the lungs every 6 (six) hours as needed for wheezing or shortness of breath.    [provider]  allopurinol (ZYLOPRIM) 100 MG tablet Take 100 mg by mouth daily.    [provider]  aspirin EC 81 MG tablet Take 81 mg by mouth daily.    [provider]  carvedilol (COREG) 25 MG tablet Take 37.5 mg by mouth 2 (two) times  daily with a meal.    [provider]  doxycycline (VIBRAMYCIN) 100 MG capsule Take 1 capsule (100 mg total) by mouth 2 (two) times daily. One po bid x 7 days 03/02/19   Julianne Rice, MD  furosemide (LASIX) 40 MG tablet Take 40 mg by mouth daily.    [provider]  Multiple Vitamins-Minerals (CENTRUM SILVER ULTRA WOMENS) TABS Take 1 tablet by mouth daily.    [provider]  omeprazole (PRILOSEC) 20 MG capsule Take 20 mg by mouth daily.    [provider]  ondansetron (ZOFRAN ODT) 4 MG disintegrating tablet 4mg  ODT q4 hours prn nausea/vomit 03/02/19   Julianne Rice, MD  sacubitril-valsartan (ENTRESTO) 97-103 MG Take 1 tablet by mouth 2 (two) times daily.    [provider]  traMADol (ULTRAM) 50 MG tablet Take 1-2 tablets (  50-100 mg total) by mouth every 6 (six) hours as needed. Patient not taking: Reported on 03/02/2019 11/26/17   Mcarthur Rossetti, MD  traMADol HCl 100 MG TABS Take 1 tablet by mouth every 8 (eight) hours as needed (pain).  01/26/19   [provider]     Critical care time: 66 minutes       CRITICAL CARE Performed by: Otilio Carpen Augustine Brannick   Total critical care time: 66 minutes  Critical care time was exclusive of separately billable procedures and treating other patients.  Critical care was necessary to treat or prevent imminent or life-threatening deterioration.  Critical care was time spent personally by me on the following activities: development of treatment plan with patient and/or surrogate as well as nursing, discussions with consultants, evaluation of patient's response to treatment, examination of patient, obtaining history from patient or surrogate, ordering and performing treatments and interventions, ordering and review of laboratory studies, ordering and review of radiographic studies, pulse oximetry and re-evaluation of patient's condition.   Otilio Carpen Xiomar [place], PA-C

## 2019-08-02 NOTE — ED Notes (Signed)
Paged Dr Rhae Hammock to rn Tray--Tristian Bouska

## 2019-08-02 NOTE — Progress Notes (Signed)
ABG drawn and resulted at 0044 was erroneously entered as venous.

## 2019-08-02 NOTE — Progress Notes (Signed)
Sputum culture collected, sent to lab.  

## 2019-08-02 NOTE — Progress Notes (Signed)
LTM EEG hooked up and running - no initial skin breakdown - push button tested - neuro notified.  

## 2019-08-02 NOTE — Progress Notes (Signed)
Patient admitted from ED. ETT, OG, Foley, and CVC in place. Patient has two pair or silver colored earrings that remain in place (a stud and loop in each ear) skin assessment complete with Clarene Critchley RN, no visible issues noted at this time. Foam placed to sacrum area for preventative measures. Will continue to provide care per MD orders.

## 2019-08-02 NOTE — ED Provider Notes (Signed)
[facility] EMERGENCY DEPARTMENT Provider Note   CSN: [id number] Arrival date & time: 07/04/2019  2347   History Chief Complaint  Patient presents with  . Cardiac Arrest    Melody Flores is a 74 y.o. female.  The history is provided by the EMS personnel. The history is limited by the condition of the patient (CPR in progress).  Cardiac Arrest She has history of hypertension, COPD, dilated cardiomyopathy and was brought in by EMS with CPR in progress.  They were called to the home because of shortness of breath and she had cardiac arrest in front of them and CPR was initiated promptly.  Initial rhythm was asystole.  She received amiodarone and a total of 8 mg of epinephrine and was placed on an epinephrine drip.  She had return of spontaneous circulation twice before arrival in the ED, and lost pulses third time just as they were pulling in so CPR was in progress as she was coming in the door.  EMS also inserted a left tibial intraosseous line and inserted a King airway.  Past Medical History:  Diagnosis Date  . Acute gouty arthritis   . Anemia   . Arthritis   . Bilateral edema of lower extremity   . Cancer ([place])    cervical cancer, had partial hysterectomy  . CHF (congestive heart failure) ([place])   . Chronic combined systolic and diastolic heart failure ([place])   . COPD (chronic obstructive pulmonary disease) ([place])   . Dilated cardiomyopathy ([place])   . Gastroesophageal reflux disease   . Gout   . Headache    sinus headaches  . Heart murmur    when she was younger, never had any problems  . Hypertension   . LBBB (left bundle branch block)   . Morbid obesity with alveolar hypoventilation ([place])   . Pneumonia   . Sleep apnea    uses cpap    Patient Active Problem List   Diagnosis Date Noted  . Chronic pain of left knee 11/29/2016  . Chronic pain of both knees 11/29/2016  . Unilateral primary osteoarthritis, left knee 11/29/2016  . Unilateral primary  osteoarthritis, right knee 11/29/2016  . Dyspnea on exertion 12/12/2014  . Congestive dilated cardiomyopathy ([place]) 12/12/2014    Past Surgical History:  Procedure Laterality Date  . CARDIAC CATHETERIZATION N/A 12/14/2014   Procedure: Right/Left Heart Cath and Coronary Angiography;  Surgeon: Adrian Prows, MD;  Location: [facility];  Service: Cardiovascular;  Laterality: N/A;  . COLONOSCOPY WITH PROPOFOL N/A 08/12/2015   Procedure: COLONOSCOPY WITH PROPOFOL;  Surgeon: Wonda Horner, MD;  Location: [facility] ENDOSCOPY;  Service: Endoscopy;  Laterality: N/A;  . PARTIAL HYSTERECTOMY  1979   abdominal, cancer  . TUBAL LIGATION       OB History    Gravida  0   Para  0   Term  0   Preterm  0   AB  0   Living        SAB  0   TAB  0   Ectopic  0   Multiple      Live Births              Family History  Problem Relation Age of Onset  . Hypertension Mother   . Pneumonia Mother   . Healthy Father   . Diabetes Other   . Asthma Other   . Colon cancer Daughter   . Healthy Son  X3  . Healthy Daughter        x2    Social History   Tobacco Use  . Smoking status: Former Smoker    Quit date: 05/17/1999    Years since quitting: 20.2  . Smokeless tobacco: Never Used  . Tobacco comment: patient states she smoked before, quit in the 2000s  Substance Use Topics  . Alcohol use: No    Alcohol/week: 0.0 standard drinks  . Drug use: No    Home Medications Prior to Admission medications   Medication Sig Start Date End Date Taking? Authorizing Provider  acetaminophen-codeine (TYLENOL #3) 300-30 MG tablet Take 1-2 tablets by mouth every 8 (eight) hours as needed for moderate pain. Patient not taking: Reported on 03/02/2019 09/09/17   Mcarthur Rossetti, MD  albuterol (PROVENTIL HFA;VENTOLIN HFA) 108 (90 BASE) MCG/ACT inhaler Inhale 1-2 puffs into the lungs every 6 (six) hours as needed for wheezing or shortness of breath.    [provider]  allopurinol  (ZYLOPRIM) 100 MG tablet Take 100 mg by mouth daily.    [provider]  aspirin EC 81 MG tablet Take 81 mg by mouth daily.    [provider]  carvedilol (COREG) 25 MG tablet Take 37.5 mg by mouth 2 (two) times daily with a meal.    [provider]  doxycycline (VIBRAMYCIN) 100 MG capsule Take 1 capsule (100 mg total) by mouth 2 (two) times daily. One po bid x 7 days 03/02/19   Julianne Rice, MD  furosemide (LASIX) 40 MG tablet Take 40 mg by mouth daily.    [provider]  Multiple Vitamins-Minerals (CENTRUM SILVER ULTRA WOMENS) TABS Take 1 tablet by mouth daily.    [provider]  omeprazole (PRILOSEC) 20 MG capsule Take 20 mg by mouth daily.    [provider]  ondansetron (ZOFRAN ODT) 4 MG disintegrating tablet 4mg  ODT q4 hours prn nausea/vomit 03/02/19   Julianne Rice, MD  sacubitril-valsartan (ENTRESTO) 97-103 MG Take 1 tablet by mouth 2 (two) times daily.    [provider]  traMADol (ULTRAM) 50 MG tablet Take 1-2 tablets (50-100 mg total) by mouth every 6 (six) hours as needed. Patient not taking: Reported on 03/02/2019 11/26/17   Mcarthur Rossetti, MD  traMADol HCl 100 MG TABS Take 1 tablet by mouth every 8 (eight) hours as needed (pain).  01/26/19   [provider]    Allergies    Penicillins and Sulfa antibiotics  Review of Systems   Review of Systems  Unable to perform ROS: Patient unresponsive    Physical Exam Updated Vital Signs BP (!) 167/88   Pulse 72   Temp (!) 96.5 F (35.8 C) (Temporal)   Resp 16   Ht 5\' 8"  (1.727 m)   Wt (!) 149.7 kg   SpO2 100%   BMI 50.18 kg/m   Physical Exam Vitals and nursing note reviewed.   Morbidly obese 74 year old female with CPR in progress. Head is normocephalic and atraumatic.  Pupils are 4 mm.King airway is in place. Neck is without adenopathy or JVD. Back is nontender and there is no CVA tenderness. Lungs have symmetric airflow. Chest moves  symmetrically. Heart tones are absent. Abdomen is soft, flat, nontender without masses or hepatosplenomegaly. Extremities: Intraosseous line present in the left proximal tibia. Skin is warm and dry without rash. Neurologic: GCS=3.  ED Results / Procedures / Treatments   Labs (all labs ordered are listed, but only abnormal results are  displayed) Labs Reviewed  RESPIRATORY PANEL BY RT PCR (FLU A&B, COVID) - Abnormal; Notable for the following components:      Result Value   SARS Coronavirus 2 by RT PCR POSITIVE (*)    All other components within normal limits  CBC WITH DIFFERENTIAL/PLATELET - Abnormal; Notable for the following components:   WBC 22.1 (*)    RBC 3.18 (*)    Hemoglobin 9.9 (*)    HCT 33.3 (*)    MCV 104.7 (*)    MCHC 29.7 (*)    Platelets 97 (*)    Neutro Abs 15.1 (*)    Lymphs Abs 5.3 (*)    Abs Immature Granulocytes 1.14 (*)    All other components within normal limits  COMPREHENSIVE METABOLIC PANEL - Abnormal; Notable for the following components:   CO2 17 (*)    Glucose, Bld 167 (*)    Creatinine, Ser 1.86 (*)    Calcium 8.0 (*)    Albumin 2.4 (*)    AST 105 (*)    ALT 54 (*)    GFR calc non Af Amer 26 (*)    GFR calc Af Amer 30 (*)    Anion gap 16 (*)    All other components within normal limits  LACTIC ACID, PLASMA - Abnormal; Notable for the following components:   Lactic Acid, Venous 8.9 (*)    All other components within normal limits  APTT - Abnormal; Notable for the following components:   aPTT 46 (*)    All other components within normal limits  PROTIME-INR - Abnormal; Notable for the following components:   Prothrombin Time 16.7 (*)    INR 1.4 (*)    All other components within normal limits  CBG MONITORING, ED - Abnormal; Notable for the following components:   Glucose-Capillary 122 (*)    All other components within normal limits  POCT I-STAT EG7 - Abnormal; Notable for the following components:   pO2, Ven 338.0 (*)    Acid-base  deficit 7.0 (*)    Calcium, Ion 1.14 (*)    HCT 32.0 (*)    Hemoglobin 10.9 (*)    All other components within normal limits  TROPONIN I (HIGH SENSITIVITY) - Abnormal; Notable for the following components:   Troponin I (High Sensitivity) 29 (*)    All other components within normal limits  URINE CULTURE  CULTURE, BLOOD (ROUTINE X 2)  CULTURE, BLOOD (ROUTINE X 2)  ETHANOL  URINALYSIS, ROUTINE W REFLEX MICROSCOPIC  RAPID URINE DRUG SCREEN, HOSP PERFORMED  LACTIC ACID, PLASMA  BRAIN NATRIURETIC PEPTIDE  MAGNESIUM  CBC  CREATININE, SERUM  I-STAT ARTERIAL BLOOD GAS, ED  ABO/RH  TROPONIN I (HIGH SENSITIVITY)    EKG EKG Interpretation  Date/Time:  Sunday August 02 2019 00:27:35 EST Ventricular Rate:  70 PR Interval:    QRS Duration: 147 QT Interval:  477 QTC Calculation: 515 R Axis:   123 Text Interpretation: Sinus rhythm Left bundle branch block When compared with ECG of 07/24/2004, Left bundle branch block is now present Confirmed by Delora Fuel ([id number]) on 08/02/2019 12:33:27 AM   Radiology DG Chest Portable 1 View  Result Date: 08/02/2019 CLINICAL DATA:  Tube placement EXAM: PORTABLE CHEST 1 VIEW COMPARISON:  Radiograph March 02, 2019 FINDINGS: *Endotracheal tube in the mid trachea, 4.2 cm from the carina. *Transesophageal tube tip and side port below the GE junction, beyond the level of imaging. *Multiple telemetry leads and monitoring devices overlie the chest. *Defibrillator pad over the left  chest wall. Patchy opacities are present throughout both lungs more focally in the right upper lobe and infrahilar lung. No visible pneumothorax or effusion. Cardiac silhouette is borderline enlarged though may be accentuated by portable supine technique. The aorta is calcified. The remaining cardiomediastinal contours are unremarkable. No acute osseous or soft tissue abnormality. IMPRESSION: Satisfactory positioning of lines and tubes. Bilateral airspace opacities, right greater than  left, could reflect edema, pneumonia or ARDS. Electronically Signed   By: Lovena Le M.D.   On: 08/02/2019 00:36    Procedures Procedure Name: Intubation Date/Time: 08/02/2019 12:00 AM Performed by: Delora Fuel, MD Pre-anesthesia Checklist: Patient identified, Emergency Drugs available, Suction available, Patient being monitored and Timeout performed Oxygen Delivery Method: Ambu bag Preoxygenation: Pre-oxygenation with 100% oxygen Laryngoscope Size: Glidescope and 3 Grade View: Grade I Number of attempts: 1 Airway Equipment and Method: Video-laryngoscopy and Rigid stylet Placement Confirmation: ETT inserted through vocal cords under direct vision,  CO2 detector and Breath sounds checked- equal and bilateral Secured at: 24 cm Tube secured with: ETT holder    .Central Line  Date/Time: 08/02/2019 12:30 AM Performed by: Delora Fuel, MD Authorized by: Delora Fuel, MD   Consent:    Consent obtained:  Emergent situation   Consent given by: Implied Consent. Pre-procedure details:    Hand hygiene: Hand hygiene performed prior to insertion     Sterile barrier technique: All elements of maximal sterile technique followed     Skin preparation:  2% chlorhexidine   Skin preparation agent: Skin preparation agent completely dried prior to procedure   Anesthesia (see MAR for exact dosages):    Anesthesia method:  None Procedure details:    Location:  R femoral   Site selection rationale:  Ease of access, no risk of pneumothorax   Patient position:  Flat   Procedural supplies:  Triple lumen   Catheter size:  7 Fr   Landmarks identified: yes     Ultrasound guidance: no     Number of attempts:  1   Successful placement: yes   Post-procedure details:    Post-procedure:  Dressing applied and line sutured   Assessment:  Blood return through all ports   Patient tolerance of procedure:  Tolerated well, no immediate complications    Cardiopulmonary Resuscitation (CPR) Procedure  Note Directed/Performed by: Delora Fuel I personally directed ancillary staff and/or performed CPR in an effort to regain return of spontaneous circulation and to maintain cardiac, neuro and systemic perfusion.   CRITICAL CARE Performed by: Delora Fuel Total critical care time: 120 minutes Critical care time was exclusive of separately billable procedures and treating other patients. Critical care was necessary to treat or prevent imminent or life-threatening deterioration. Critical care was time spent personally by me on the following activities: development of treatment plan with patient and/or surrogate as well as nursing, discussions with consultants, evaluation of patient's response to treatment, examination of patient, obtaining history from patient or surrogate, ordering and performing treatments and interventions, ordering and review of laboratory studies, ordering and review of radiographic studies, pulse oximetry and re-evaluation of patient's condition.  Medications Ordered in ED Medications  EPINEPHrine (ADRENALIN) 4 mg in dextrose 5 % 250 mL (0.016 mg/mL) infusion (0.5 mcg/min Intravenous New Bag/Given 08/02/19 0033)  levofloxacin (LEVAQUIN) IVPB 750 mg (750 mg Intravenous New Bag/Given 08/02/19 0118)  aspirin EC tablet 81 mg (has no administration in time range)  heparin injection 5,000 Units (has no administration in time range)  pantoprazole (PROTONIX) injection 40 mg (has no  administration in time range)  midazolam (VERSED) injection 5 mg (5 mg Intravenous Given 08/02/19 0104)    ED Course  I have reviewed the triage vital signs and the nursing notes.  Pertinent labs & imaging results that were available during my care of the patient were reviewed by me and considered in my medical decision making (see chart for details).  MDM Rules/Calculators/A&P Patient with witnessed cardiac arrest arrived with CPR in progress.  After transfer to our stretcher, pulse check did show  presence of femoral pulses so CPR was discontinued.  King airway was removed and replaced with an endotracheal tube.  Right femoral 3 lm venous line was placed and intraosseous line removed.  Epinephrine drip was moved to the femoral venous line.  Old records are reviewed, and she has no recent visits in our system.  Cardiac catheterization December 14, 2014 showed no lesions greater than 30%, moderate pulmonary hypertension, dilated nonischemic cardiomyopathy with ejection fraction 20%.  Case has been discussed with Merlene Laughter of critical care service who will make arrangements for an intensivist to see the patient.  Cardiology has been paged.    ECG shows a left bundle branch block which is new compared with 2006, but she is noted to have left bundle branch block listed on her problem list.  Chest x-ray shows adequate position of endotracheal tube, right-sided infiltrates suspicious for aspiration but will cover for possible pneumonia.  Also, patient did have some withdrawal response when swab was obtained for COVID-19.  She is now developing intermittent myoclonic jerks.  COVID-19 screen is positive.  Labs show significant elevated lactic acid level but it is difficult to interpret this in the setting of status post cardiac arrest as low flow state of CPR will also give significantly elevated lactic acid level.  There is evidence of acute kidney injury with creatinine 1.86.  Hemoglobin has dropped 1 g over the last 5 months and is now 9.9.  Leukocytosis is present and felt to be a stress reaction and nonspecific.  Blood gas shows mainly metabolic acidosis consistent with status post CPR and a lactic acidosis from low flow state.  Initial troponin is mildly elevated of uncertain significance.  I have discussed these findings with Dr. Rhae Hammock of cardiology who is coming to see the patient in consultation.  ELLORY REARDEN was evaluated in Emergency Department on 08/02/2019 for the symptoms described in the  history of present illness. She was evaluated in the context of the global COVID-19 pandemic, which necessitated consideration that the patient might be at risk for infection with the SARS-CoV-2 virus that causes COVID-19. Institutional protocols and algorithms that pertain to the evaluation of patients at risk for COVID-19 are in a state of rapid change based on information released by regulatory bodies including the CDC and federal and state organizations. These policies and algorithms were followed during the patient's care in the ED.   Final Clinical Impression(s) / ED Diagnoses Final diagnoses:  Cardiac arrest ([place])  Lab test positive for detection of COVID-19 virus  Acute kidney injury (nontraumatic) ([facility])  Normochromic normocytic anemia  Elevated troponin    Rx / DC Orders ED Discharge Orders    None       Delora Fuel, MD [phone]

## 2019-08-02 NOTE — ED Notes (Signed)
PAGED CCM TO RN TRAY--Melody Flores

## 2019-08-02 NOTE — ED Triage Notes (Signed)
Initial EMS call for SOB, EMS arrived to pt as a witnessed cardiac arrest at 2300, rhythm asystole, CPR started at 2301. IO placed to L tibia; epi x 5 given (epi gtt infusion by EMS), shocked at 200J x 1 for vtach, 300mg  amiodarone given. ROSC at 2320 and 2337.

## 2019-08-03 ENCOUNTER — Inpatient Hospital Stay (HOSPITAL_COMMUNITY): Payer: Medicare Other

## 2019-08-03 DIAGNOSIS — R9401 Abnormal electroencephalogram [EEG]: Secondary | ICD-10-CM

## 2019-08-03 DIAGNOSIS — I469 Cardiac arrest, cause unspecified: Secondary | ICD-10-CM

## 2019-08-03 DIAGNOSIS — G931 Anoxic brain damage, not elsewhere classified: Secondary | ICD-10-CM

## 2019-08-03 DIAGNOSIS — J9601 Acute respiratory failure with hypoxia: Secondary | ICD-10-CM

## 2019-08-03 DIAGNOSIS — N179 Acute kidney failure, unspecified: Secondary | ICD-10-CM

## 2019-08-03 LAB — BASIC METABOLIC PANEL
Anion gap: 14 (ref 5–15)
BUN: 35 mg/dL — ABNORMAL HIGH (ref 8–23)
CO2: 19 mmol/L — ABNORMAL LOW (ref 22–32)
Calcium: 8.1 mg/dL — ABNORMAL LOW (ref 8.9–10.3)
Chloride: 107 mmol/L (ref 98–111)
Creatinine, Ser: 4.17 mg/dL — ABNORMAL HIGH (ref 0.44–1.00)
GFR calc Af Amer: 11 mL/min — ABNORMAL LOW (ref >60)
GFR calc non Af Amer: 10 mL/min — ABNORMAL LOW (ref >60)
Glucose, Bld: 134 mg/dL — ABNORMAL HIGH (ref 70–99)
Potassium: 4 mmol/L (ref 3.5–5.1)
Sodium: 140 mmol/L (ref 135–145)

## 2019-08-03 LAB — GLUCOSE, CAPILLARY
Glucose-Capillary: 92 mg/dL (ref 70–99)
Glucose-Capillary: 121 mg/dL — ABNORMAL HIGH (ref 70–99)
Glucose-Capillary: 109 mg/dL — ABNORMAL HIGH (ref 70–99)
Glucose-Capillary: 123 mg/dL — ABNORMAL HIGH (ref 70–99)
Glucose-Capillary: 90 mg/dL (ref 70–99)

## 2019-08-03 LAB — POCT I-STAT 7, (LYTES, BLD GAS, ICA,H+H)
Acid-base deficit: 4 mmol/L — ABNORMAL HIGH (ref 0.0–2.0)
Bicarbonate: 19.2 mmol/L — ABNORMAL LOW (ref 20.0–28.0)
Calcium, Ion: 1.14 mmol/L — ABNORMAL LOW (ref 1.15–1.40)
HCT: 34 % — ABNORMAL LOW (ref 36.0–46.0)
Hemoglobin: 11.6 g/dL — ABNORMAL LOW (ref 12.0–15.0)
O2 Saturation: 97 %
Patient temperature: 98.6
Potassium: 3.9 mmol/L (ref 3.5–5.1)
Sodium: 138 mmol/L (ref 135–145)
TCO2: 20 mmol/L — ABNORMAL LOW (ref 22–32)
pCO2 arterial: 30.1 mmHg — ABNORMAL LOW (ref 32.0–48.0)
pH, Arterial: 7.413 (ref 7.350–7.450)
pO2, Arterial: 87 mmHg (ref 83.0–108.0)

## 2019-08-03 LAB — MAGNESIUM: Magnesium: 2 mg/dL (ref 1.7–2.4)

## 2019-08-03 LAB — CBC
HCT: 34.6 % — ABNORMAL LOW (ref 36.0–46.0)
Hemoglobin: 11.3 g/dL — ABNORMAL LOW (ref 12.0–15.0)
MCH: 30.5 pg (ref 26.0–34.0)
MCHC: 32.7 g/dL (ref 30.0–36.0)
MCV: 93.3 fL (ref 80.0–100.0)
Platelets: 130 10*3/uL — ABNORMAL LOW (ref 150–400)
RBC: 3.71 MIL/uL — ABNORMAL LOW (ref 3.87–5.11)
RDW: 13.3 % (ref 11.5–15.5)
WBC: 16.3 10*3/uL — ABNORMAL HIGH (ref 4.0–10.5)
nRBC: 0 % (ref 0.0–0.2)

## 2019-08-03 LAB — PHOSPHORUS: Phosphorus: 3.6 mg/dL (ref 2.5–4.6)

## 2019-08-03 LAB — LACTIC ACID, PLASMA: Lactic Acid, Venous: 1.3 mmol/L (ref 0.5–1.9)

## 2019-08-03 MED ORDER — VITAL HIGH PROTEIN PO LIQD
1000.0000 mL | ORAL | Status: DC
  Administered 2019-08-03: 1000 mL

## 2019-08-03 MED ORDER — PRO-STAT SUGAR FREE PO LIQD
30.0000 mL | Freq: Every day | ORAL | Status: DC
  Administered 2019-08-03: 30 mL
  Filled 2019-08-03 (×2): qty 30

## 2019-08-03 MED ORDER — ORAL CARE MOUTH RINSE
15.0000 mL | OROMUCOSAL | Status: DC
  Administered 2019-08-03 – 2019-08-05 (×19): 15 mL via OROMUCOSAL

## 2019-08-03 MED ORDER — CHLORHEXIDINE GLUCONATE 0.12% ORAL RINSE (MEDLINE KIT)
15.0000 mL | Freq: Two times a day (BID) | OROMUCOSAL | Status: DC
  Administered 2019-08-03 – 2019-08-04 (×3): 15 mL via OROMUCOSAL

## 2019-08-03 MED ORDER — FREE WATER
95.0000 mL | Freq: Four times a day (QID) | Status: DC
  Administered 2019-08-03 – 2019-08-04 (×4): 95 mL

## 2019-08-03 MED ORDER — LABETALOL HCL 5 MG/ML IV SOLN
10.0000 mg | Freq: Four times a day (QID) | INTRAVENOUS | Status: DC | PRN
  Administered 2019-08-03: 10 mg via INTRAVENOUS
  Filled 2019-08-03: qty 4

## 2019-08-03 NOTE — Progress Notes (Signed)
Initial Nutrition Assessment  DOCUMENTATION CODES:   Morbid obesity  INTERVENTION:   Initiate TF via OG with Vital High Protein at 69ml/h, increase 53ml/h Q8 until goal rate of 70 ml/h (1,680 ml per day) and Prostat 30 ml daily to provide 1,780 kcals, 162 gm protein, 1,404 ml free water daily.  Recommend 16mL free water flush Q6 (or per MD) to provide a total of 1744mL free water daily  NUTRITION DIAGNOSIS:   Inadequate oral intake related to inability to eat as evidenced by NPO status.   GOAL:   Patient will meet greater than or equal to 90% of their needs   MONITOR:   TF tolerance, Vent status, Weight trends, Labs, I & O's  REASON FOR ASSESSMENT:   Consult Enteral/tube feeding initiation and management  ASSESSMENT:   Pt with PMH significant for dilated cardiomyopathy, heart failure, gout, GERD, OSA, COPD, HTN, cervical Ca brought in with witnessed cardiac arrest x3 requiring CPR and total downtime of about 30 minutes prior to ROSC. Pt Covid positive.  Per neurology, pt started exhibiting whole body myoclonic jerking after propofol was held for exam. EEG to evaluate for seizures and suggestive of severe diffuse encephalopathy, non specific to etiology  Pt's granddaughter (who lives with pt) reported pt had not been C/O any illness and that she was her "normal self" yesterday outside of SOB.   Medications reviewed and include: Decadron  Labs reviewed: BUN 35 (H), Cr 4.17 (H) CBGs 92-123  UOP: 321mL x 24 hours I/O: +471.55mL since admit   Patient is currently intubated on ventilator support MV: 8.4 L/min Temp (24hrs), Avg:99.7 F (37.6 C), Min:98.4 F (36.9 C), Max:100.8 F (38.2 C)  Propofol: 30ml/hr RN reports Propofol has been off since last night.   NUTRITION - FOCUSED PHYSICAL EXAM:  Deferred  Diet Order:   Diet Order            Diet NPO time specified  Diet effective now              EDUCATION NEEDS:   Not appropriate for education at this  time  Skin:  Skin Assessment: Reviewed RN Assessment  Last BM:  08/02/19  Height:   Ht Readings from Last 1 Encounters:  08/02/19 5\' 8"  (1.727 m)    Weight:   Wt Readings from Last 1 Encounters:  08/03/19 (!) 144 kg    Ideal Body Weight:  63.63 kg  BMI:  Body mass index is 48.27 kg/m.  Estimated Nutritional Needs:   Kcal:  [id number]  Protein:  160 grams  Fluid:  >1.5L/d   Larkin Ina, MS, RD, LDN Pager: [phone] Weekend/After Hours Pager: [phone]

## 2019-08-03 NOTE — Progress Notes (Signed)
Pt transported to MRI with this nurse, transport, charge nurse and respiratory.

## 2019-08-03 NOTE — Procedures (Addendum)
Patient Name: Melody Flores  MRN: [id number]  Epilepsy Attending: Lora Havens  Referring Physician/Provider: Dr Amie [place] Duration: 08/02/2019 [id number] 08/03/2019 1046  Patient history: 74 year old woman with dilated cardiomyopathy, moderate dilated LA, nonobstructive CAD, OSA, COPD, hypertension, cervical cancer brought in with witnessed cardiac arrest x3 requiring CPR and total downtime of about 30 minutes prior to ROSC. She tested to be Covid positive and has been placed in the Covid ICU after intubation. She started exhibiting whole body myoclonic jerking after propofol was held for exam. EEG to evaluate for seizures.   Level of alertness: comatose  AEDs during EEG study: propofol, keppra  Technical aspects: This EEG study was done with scalp electrodes positioned according to the 10-20 International system of electrode placement. Electrical activity was acquired at a sampling rate of 500Hz  and reviewed with a high frequency filter of 70Hz  and a low frequency filter of 1Hz . EEG data were recorded continuously and digitally stored.   DESCRIPTION: EEG showed continuous generalized 3-6hz  theta-delta slowing as well as intermittent rhythmic 2-3hz  delta slowing. Hyperventilation and photic stimulation were not performed.  ABNORMALITY - Continuous slow, generalized - Intermittent rhythmic slow, generalized   IMPRESSION: This study is suggestive of severe diffuse encephalopathy, non specific to etiology.  No seizures or epileptiform discharges were seen throughout the recording.     Catriona Dillenbeck Barbra Sarks

## 2019-08-03 NOTE — Plan of Care (Signed)
  Problem: Education: Goal: Knowledge of risk factors and measures for prevention of condition will improve Outcome: Progressing   Problem: Coping: Goal: Psychosocial and spiritual needs will be supported Outcome: Progressing   Problem: Respiratory: Goal: Will maintain a patent airway Outcome: Progressing Goal: Complications related to the disease process, condition or treatment will be avoided or minimized Outcome: Progressing   

## 2019-08-03 NOTE — Progress Notes (Signed)
I reviewed CT head, findings on CT head concerning for stroke possibly streak artifact, also in consideration given history of temporal lobe hypodensity seizure focus. Reviewed EEG, could not appreciate any seizure activity. Patient will need MRI brain after EEG leads disconnected tomorrow.

## 2019-08-03 NOTE — Progress Notes (Signed)
Pt brought down for MRI brain W WO. Changed to WO due to pt GFR of 11.

## 2019-08-03 NOTE — Progress Notes (Signed)
Subjective: Biting intermittently on ET tube.   Objective: Current vital signs: BP (!) 176/97   Pulse 94   Temp 99.9 F (37.7 C)   Resp (!) 23   Ht _0  (1.727 m)   Wt (!) 144 kg   SpO2 99%   BMI 48.27 kg/m  Vital signs in last 24 hours: Temp:  [99.5 F (37.5 C)-101.1 F (38.4 C)] 99.9 F (37.7 C) (02/01 0800) Pulse Rate:  [72-94] 94 (02/01 0700) Resp:  [18-29] 23 (02/01 0700) BP: (110-184)/(24-97) 176/97 (02/01 0700) SpO2:  [92 %-99 %] 99 % (02/01 0700) FiO2 (%):  [40 %-50 %] 40 % (02/01 0814) Weight:  [144 kg] 144 kg (02/01 0443)  Intake/Output from previous day: 01/31 0701 - 02/01 0700 In: 384.1 [I.V.:290.6; IV Piggyback:93.6] Out: 250 [Urine:250] Intake/Output this shift: No intake/output data recorded. Nutritional status:  Diet Order            Diet NPO time specified  Diet effective now              Neurologic Exam: Ment: No responses to any external stimuli. Eyes are partially open.  CN: Pupils 4 mm and reactive bilaterally. No blink to threat. No eye movement towards or away from visual stimuli. Exotropia noted. Doll's eye reflex present. No blink to eyelash stimulation. Intermittently biting on ET tube in stereotyped fashion, but no facial myoclonus noted. No cough reflex.  Motor/Sensory: Flaccid tone x 4 except for slightly increase triceps tone bilaterally. No movement to noxious stimuli x 4.  Reflexes: 2+ biceps reflex on the left. Unable to elicit right biceps. Unable to elicit patellar reflexes in the context of significant adiposity.   Lab Results: Results for orders placed or performed during the hospital encounter of 07/27/2019 (from the past 48 hour(s))  Troponin I (High Sensitivity)     Status: Abnormal   Collection Time: 08/02/19 12:05 AM  Result Value Ref Range   Troponin I (High Sensitivity) 29 (H) <18 ng/L    Comment: (NOTE) Elevated high sensitivity troponin I (hsTnI) values and significant  changes across serial measurements may  suggest ACS but many other  chronic and acute conditions are known to elevate hsTnI results.  Refer to the "Links" section for chest pain algorithms and additional  guidance. Performed at [facility], [place] [address] [place], [place] [address]   CBC with Differential     Status: Abnormal   Collection Time: 08/02/19 12:05 AM  Result Value Ref Range   WBC 22.1 (H) 4.0 - 10.5 K/uL   RBC 3.18 (L) 3.87 - 5.11 MIL/uL   Hemoglobin 9.9 (L) 12.0 - 15.0 g/dL   HCT 33.3 (L) 36.0 - 46.0 %   MCV 104.7 (H) 80.0 - 100.0 fL   MCH 31.1 26.0 - 34.0 pg   MCHC 29.7 (L) 30.0 - 36.0 g/dL   RDW 13.5 11.5 - 15.5 %   Platelets 97 (L) 150 - 400 K/uL    Comment: REPEATED TO VERIFY PLATELET COUNT CONFIRMED BY SMEAR SPECIMEN CHECKED FOR CLOTS    nRBC 0.0 0.0 - 0.2 %   Neutrophils Relative % 68 %   Neutro Abs 15.1 (H) 1.7 - 7.7 K/uL   Lymphocytes Relative 24 %   Lymphs Abs 5.3 (H) 0.7 - 4.0 K/uL   Monocytes Relative 2 %   Monocytes Absolute 0.4 0.1 - 1.0 K/uL   Eosinophils Relative 1 %   Eosinophils Absolute 0.1 0.0 - 0.5 K/uL   Basophils Relative 0 %  Basophils Absolute 0.0 0.0 - 0.1 K/uL   WBC Morphology MILD LEFT SHIFT (1-5% METAS, OCC MYELO, OCC BANDS)    Immature Granulocytes 5 %   Abs Immature Granulocytes 1.14 (H) 0.00 - 0.07 K/uL    Comment: Performed at [facility] [address], [place], [place] [address]  Comprehensive metabolic panel     Status: Abnormal   Collection Time: 08/02/19 12:05 AM  Result Value Ref Range   Sodium 139 135 - 145 mmol/L   Potassium 4.8 3.5 - 5.1 mmol/L   Chloride 106 98 - 111 mmol/L   CO2 17 (L) 22 - 32 mmol/L   Glucose, Bld 167 (H) 70 - 99 mg/dL   BUN 14 8 - 23 mg/dL   Creatinine, Ser 1.86 (H) 0.44 - 1.00 mg/dL   Calcium 8.0 (L) 8.9 - 10.3 mg/dL   Total Protein 6.5 6.5 - 8.1 g/dL   Albumin 2.4 (L) 3.5 - 5.0 g/dL   AST 105 (H) 15 - 41 U/L   ALT 54 (H) 0 - 44 U/L   Alkaline Phosphatase 104 38 - 126 U/L   Total Bilirubin 0.5 0.3 - 1.2 mg/dL    GFR calc non Af Amer 26 (L) >60 mL/min   GFR calc Af Amer 30 (L) >60 mL/min   Anion gap 16 (H) 5 - 15    Comment: Performed at [facility], [place] [address] [place], [place] [address]  Ethanol     Status: None   Collection Time: 08/02/19 12:05 AM  Result Value Ref Range   Alcohol, Ethyl (B) <10 <10 mg/dL    Comment: (NOTE) Lowest detectable limit for serum alcohol is 10 mg/dL. For medical purposes only. Performed at [facility], [place] [address], [place], [place] [address]   Lactic acid, plasma     Status: Abnormal   Collection Time: 08/02/19 12:05 AM  Result Value Ref Range   Lactic Acid, Venous 8.9 (HH) 0.5 - 1.9 mmol/L    Comment: CRITICAL RESULT CALLED TO, READ BACK BY AND VERIFIED WITH: OLDLAND B,RN 08/02/19 0126 WAYK Performed at [facility], [place] [address], [place], [place] [address]   APTT     Status: Abnormal   Collection Time: 08/02/19 12:05 AM  Result Value Ref Range   aPTT 46 (H) 24 - 36 seconds    Comment:        IF BASELINE aPTT IS ELEVATED, SUGGEST PATIENT RISK ASSESSMENT BE USED TO DETERMINE APPROPRIATE ANTICOAGULANT THERAPY. Performed at [facility], [place] [address], [place], [place] [address]   Protime-INR     Status: Abnormal   Collection Time: 08/02/19 12:05 AM  Result Value Ref Range   Prothrombin Time 16.7 (H) 11.4 - 15.2 seconds   INR 1.4 (H) 0.8 - 1.2    Comment: (NOTE) INR goal varies based on device and disease states. Performed at [facility], [place] [address], [place], [place] [address]   Respiratory Panel by RT PCR (Flu A&B, Covid) - Nasopharyngeal Swab     Status: Abnormal   Collection Time: 08/02/19 12:18 AM   Specimen: Nasopharyngeal Swab  Result Value Ref Range   SARS Coronavirus 2 by RT PCR POSITIVE (A) NEGATIVE    Comment: RESULT CALLED TO, READ BACK BY AND VERIFIED WITH: T. JORDAN,RN 0145 08/02/2019 T. TYSOR (NOTE) SARS-CoV-2 target nucleic acids are DETECTED. SARS-CoV-2 RNA is generally detectable  in upper respiratory specimens  during the acute phase of infection.  Positive results are indicative of the presence of the identified virus, but do not rule out bacterial infection or co-infection with other pathogens not detected by the test. Clinical correlation with patient history and other diagnostic information is necessary to determine patient infection status. The expected result is Negative. Fact Sheet for Patients:  PinkCheek.be Fact Sheet for Healthcare Providers: GravelBags.it This test is not yet approved or cleared by the [place] FDA and  has been authorized for detection and/or diagnosis of SARS-CoV-2 by FDA under an Emergency Use Authorization (EUA).  This EUA will remain in effect (meaning this test can be used)  for the duration of  the COVID-19 declaration under Section 564(b)(1) of the Act, 21 U.S.C. section 360bbb-3(b)(1), unless the authorization is terminated or revoked sooner.    Influenza A by PCR NEGATIVE NEGATIVE   Influenza B by PCR NEGATIVE NEGATIVE    Comment: (NOTE) The Xpert Xpress SARS-CoV-2/FLU/RSV assay is intended as an aid in  the diagnosis of influenza from Nasopharyngeal swab specimens and  should not be used as a sole basis for treatment. Nasal washings and  aspirates are unacceptable for Xpert Xpress SARS-CoV-2/FLU/RSV  testing. Fact Sheet for Patients: PinkCheek.be Fact Sheet for Healthcare Providers: GravelBags.it This test is not yet approved or cleared by the [place] FDA and  has been authorized for detection and/or diagnosis of SARS-CoV-2 by  FDA under an Emergency Use Authorization (EUA). This EUA will remain  in effect (meaning this test can be used) for the duration of the  Covid-19 declaration under Section 564(b)(1) of the Act, 21  U.S.C. section 360bbb-3(b)(1), unless the authorization is  terminated or  revoked. Performed at [facility], [place] [address], [place], [place] [address]   POC CBG, ED     Status: Abnormal   Collection Time: 08/02/19 12:31 AM  Result Value Ref Range   Glucose-Capillary 122 (H) 70 - 99 mg/dL  POCT I-Stat EG7     Status: Abnormal   Collection Time: 08/02/19 12:44 AM  Result Value Ref Range   pH, Ven 7.252 7.250 - 7.430   pCO2, Ven 45.4 44.0 - 60.0 mmHg   pO2, Ven 338.0 (H) 32.0 - 45.0 mmHg   Bicarbonate 20.3 20.0 - 28.0 mmol/L   TCO2 22 22 - 32 mmol/L   O2 Saturation 100.0 %   Acid-base deficit 7.0 (H) 0.0 - 2.0 mmol/L   Sodium 140 135 - 145 mmol/L   Potassium 4.6 3.5 - 5.1 mmol/L   Calcium, Ion 1.14 (L) 1.15 - 1.40 mmol/L   HCT 32.0 (L) 36.0 - 46.0 %   Hemoglobin 10.9 (L) 12.0 - 15.0 g/dL   Patient temperature 96.5 F    Collection site RADIAL, ALLEN'S TEST ACCEPTABLE    Drawn by Operator    Sample type VENOUS   ABO/Rh     Status: None   Collection Time: 08/02/19  2:20 AM  Result Value Ref Range   ABO/RH(D)      A POS Performed at [facility], [address] [place], [place] [address]   Urinalysis, Routine w reflex microscopic     Status: Abnormal   Collection Time: 08/02/19  2:50 AM  Result Value Ref Range   Color, Urine YELLOW YELLOW   APPearance CLOUDY (A) CLEAR   Specific Gravity, Urine 1.011 1.005 - 1.030   pH 7.0 5.0 - 8.0   Glucose, UA NEGATIVE NEGATIVE mg/dL   Hgb urine dipstick MODERATE (A) NEGATIVE   Bilirubin Urine  NEGATIVE NEGATIVE   Ketones, ur NEGATIVE NEGATIVE mg/dL   Protein, ur 100 (A) NEGATIVE mg/dL   Nitrite POSITIVE (A) NEGATIVE   Leukocytes,Ua LARGE (A) NEGATIVE   RBC / HPF 21-50 0 - 5 RBC/hpf   WBC, UA >50 (H) 0 - 5 WBC/hpf   Bacteria, UA FEW (A) NONE SEEN   Squamous Epithelial / LPF 0-5 0 - 5   WBC Clumps PRESENT    Mucus PRESENT     Comment: Performed at [facility], [place] [address] [place], [place] [address]  Urine culture     Status: Abnormal (Preliminary result)   Collection Time: 08/02/19   2:50 AM   Specimen: Urine, Catheterized  Result Value Ref Range   Specimen Description URINE, CATHETERIZED    Special Requests NONE    Culture (A)     50,000 COLONIES/mL GRAM NEGATIVE RODS IDENTIFICATION AND SUSCEPTIBILITIES TO FOLLOW Performed at [facility], [place] [address] [place], [place] [address]    Report Status PENDING   Urine rapid drug screen (hosp performed)     Status: None   Collection Time: 08/02/19  2:50 AM  Result Value Ref Range   Opiates NONE DETECTED NONE DETECTED   Cocaine NONE DETECTED NONE DETECTED   Benzodiazepines NONE DETECTED NONE DETECTED   Amphetamines NONE DETECTED NONE DETECTED   Tetrahydrocannabinol NONE DETECTED NONE DETECTED   Barbiturates NONE DETECTED NONE DETECTED    Comment: (NOTE) DRUG SCREEN FOR MEDICAL PURPOSES ONLY.  IF CONFIRMATION IS NEEDED FOR ANY PURPOSE, NOTIFY LAB WITHIN 5 DAYS. LOWEST DETECTABLE LIMITS FOR URINE DRUG SCREEN Drug Class                     Cutoff (ng/mL) Amphetamine and metabolites    1000 Barbiturate and metabolites    200 Benzodiazepine                 465 Tricyclics and metabolites     300 Opiates and metabolites        300 Cocaine and metabolites        300 THC                            50 Performed at [facility], [place] [address], [place], [place] [address]   Culture, respiratory (non-expectorated)     Status: None (Preliminary result)   Collection Time: 08/02/19  4:38 AM   Specimen: Tracheal Aspirate; Respiratory  Result Value Ref Range   Specimen Description TRACHEAL ASPIRATE    Special Requests NONE    Gram Stain      FEW WBC PRESENT, PREDOMINANTLY PMN MODERATE GRAM POSITIVE COCCI IN PAIRS IN CHAINS IN CLUSTERS FEW GRAM NEGATIVE RODS FEW GRAM POSITIVE RODS Performed at [facility], [place] [address], [place], [place] [address]    Culture PENDING    Report Status PENDING   CBC     Status: Abnormal   Collection Time: 08/02/19  5:21 AM  Result Value Ref Range   WBC 11.3 (H)  4.0 - 10.5 K/uL   RBC 3.67 (L) 3.87 - 5.11 MIL/uL   Hemoglobin 11.2 (L) 12.0 - 15.0 g/dL   HCT 36.0 36.0 - 46.0 %   MCV 98.1 80.0 - 100.0 fL   MCH 30.5 26.0 - 34.0 pg   MCHC 31.1 30.0 - 36.0 g/dL   RDW 13.6 11.5 - 15.5 %   Platelets 137 (L) 150 -  400 K/uL   nRBC 0.0 0.0 - 0.2 %    Comment: Performed at [facility], [place] [address], [place], [place] [address]  Comprehensive metabolic panel     Status: Abnormal   Collection Time: 08/02/19  5:21 AM  Result Value Ref Range   Sodium 140 135 - 145 mmol/L   Potassium 4.0 3.5 - 5.1 mmol/L   Chloride 106 98 - 111 mmol/L   CO2 22 22 - 32 mmol/L   Glucose, Bld 163 (H) 70 - 99 mg/dL   BUN 18 8 - 23 mg/dL   Creatinine, Ser 2.05 (H) 0.44 - 1.00 mg/dL   Calcium 8.3 (L) 8.9 - 10.3 mg/dL   Total Protein 7.2 6.5 - 8.1 g/dL   Albumin 2.7 (L) 3.5 - 5.0 g/dL   AST 159 (H) 15 - 41 U/L   ALT 72 (H) 0 - 44 U/L   Alkaline Phosphatase 120 38 - 126 U/L   Total Bilirubin 1.0 0.3 - 1.2 mg/dL   GFR calc non Af Amer 23 (L) >60 mL/min   GFR calc Af Amer 27 (L) >60 mL/min   Anion gap 12 5 - 15    Comment: Performed at [facility], [place] [address] [place], [place] [address]  Ferritin     Status: Abnormal   Collection Time: 08/02/19  5:21 AM  Result Value Ref Range   Ferritin 639 (H) 11 - 307 ng/mL    Comment: Performed at [facility], [place] [address] [place], [place] [address]  C-reactive protein     Status: Abnormal   Collection Time: 08/02/19  5:21 AM  Result Value Ref Range   CRP 11.1 (H) <1.0 mg/dL    Comment: Performed at [facility] [address], [place], [place] [address]  Lactate dehydrogenase     Status: Abnormal   Collection Time: 08/02/19  5:21 AM  Result Value Ref Range   LDH 517 (H) 98 - 192 U/L    Comment: Performed at [facility], [place] [address] [place], [place] [address]  Hemoglobin A1c     Status: None   Collection Time: 08/02/19  5:21 AM  Result Value Ref Range   Hgb A1c MFr Bld 5.6 4.8 - 5.6 %     Comment: (NOTE) Pre diabetes:          5.7%-6.4% Diabetes:              >6.4% Glycemic control for   <7.0% adults with diabetes    Mean Plasma Glucose 114.02 mg/dL    Comment: Performed at [facility] [address], [place], [place] [address]  Brain natriuretic peptide     Status: Abnormal   Collection Time: 08/02/19  5:21 AM  Result Value Ref Range   B Natriuretic Peptide 1,539.8 (H) 0.0 - 100.0 pg/mL    Comment: Performed at [facility] [address], [place], [place] [address]  Magnesium     Status: None   Collection Time: 08/02/19  5:21 AM  Result Value Ref Range   Magnesium 1.9 1.7 - 2.4 mg/dL    Comment: Performed at [facility] [address] [place], [place] [address]  Lipid panel     Status: None   Collection Time: 08/02/19  5:21 AM  Result Value Ref Range   Cholesterol 136 0 - 200 mg/dL   Triglycerides 66 <150 mg/dL   HDL 43 >40 mg/dL  Total CHOL/HDL Ratio 3.2 RATIO   VLDL 13 0 - 40 mg/dL   LDL Cholesterol 80 0 - 99 mg/dL    Comment:        Total Cholesterol/HDL:CHD Risk Coronary Heart Disease Risk Table                     Men   Women  1/2 Average Risk   3.4   3.3  Average Risk       5.0   4.4  2 X Average Risk   9.6   7.1  3 X Average Risk  23.4   11.0        Use the calculated Patient Ratio above and the CHD Risk Table to determine the patient's CHD Risk.        ATP III CLASSIFICATION (LDL):  <100     mg/dL   Optimal  100-129  mg/dL   Near or Above                    Optimal  130-159  mg/dL   Borderline  160-189  mg/dL   High  >190     mg/dL   Very High Performed at [facility] [address] [place], [place] [address]   Troponin I (High Sensitivity)     Status: Abnormal   Collection Time: 08/02/19  5:21 AM  Result Value Ref Range   Troponin I (High Sensitivity) 214 (HH) <18 ng/L    Comment: CRITICAL RESULT CALLED TO, READ BACK BY AND VERIFIED WITH: T.JORDAN RN @ [phone] 08/02/2019 BY C.EDENS (NOTE) Elevated  high sensitivity troponin I (hsTnI) values and significant  changes across serial measurements may suggest ACS but many other  chronic and acute conditions are known to elevate hsTnI results.  Refer to the Links section for chest pain algorithms and additional  guidance. Performed at [facility], [place] [address] [place], [place] [address]   I-STAT 7, (LYTES, BLD GAS, ICA, H+H)     Status: Abnormal   Collection Time: 08/02/19  5:24 AM  Result Value Ref Range   pH, Arterial 7.368 7.350 - 7.450   pCO2 arterial 38.9 32.0 - 48.0 mmHg   pO2, Arterial 65.0 (L) 83.0 - 108.0 mmHg   Bicarbonate 22.1 20.0 - 28.0 mmol/L   TCO2 23 22 - 32 mmol/L   O2 Saturation 90.0 %   Acid-base deficit 2.0 0.0 - 2.0 mmol/L   Sodium 140 135 - 145 mmol/L   Potassium 3.9 3.5 - 5.1 mmol/L   Calcium, Ion 1.17 1.15 - 1.40 mmol/L   HCT 36.0 36.0 - 46.0 %   Hemoglobin 12.2 12.0 - 15.0 g/dL   Patient temperature 101.2 F    Collection site RADIAL, ALLEN'S TEST ACCEPTABLE    Drawn by Operator    Sample type ARTERIAL   Glucose, capillary     Status: Abnormal   Collection Time: 08/02/19  8:42 AM  Result Value Ref Range   Glucose-Capillary 123 (H) 70 - 99 mg/dL  MRSA PCR Screening     Status: Abnormal   Collection Time: 08/02/19  9:20 AM   Specimen: Nasal Mucosa; Nasopharyngeal  Result Value Ref Range   MRSA by PCR (A) NEGATIVE    INVALID, UNABLE TO DETERMINE THE PRESENCE OF TARGET DUE TO SPECIMEN INTEGRITY. RECOLLECTION REQUESTED.    Comment:        The GeneXpert MRSA Assay (FDA approved for NASAL specimens only), is one component  of a comprehensive MRSA colonization surveillance program. It is not intended to diagnose MRSA infection nor to guide or monitor treatment for MRSA infections. Results Called toMeryl Crutch RN, AT [phone] 08/02/19 BY NF Performed at [facility], [place] [address] [place], [place] [address]   D-dimer, quantitative (not at [facility])     Status: Abnormal   Collection Time:  08/02/19 11:40 AM  Result Value Ref Range   D-Dimer, Quant >20.00 (H) 0.00 - 0.50 ug/mL-FEU    Comment: (NOTE) At the manufacturer cut-off of 0.50 ug/mL FEU, this assay has been documented to exclude PE with a sensitivity and negative predictive value of 97 to 99%.  At this time, this assay has not been approved by the FDA to exclude DVT/VTE. Results should be correlated with clinical presentation. Performed at [facility], [place] [address] [place], [place] [address]   Glucose, capillary     Status: Abnormal   Collection Time: 08/02/19 11:56 AM  Result Value Ref Range   Glucose-Capillary 114 (H) 70 - 99 mg/dL  Glucose, capillary     Status: Abnormal   Collection Time: 08/02/19  4:25 PM  Result Value Ref Range   Glucose-Capillary 106 (H) 70 - 99 mg/dL  Glucose, capillary     Status: Abnormal   Collection Time: 08/02/19  8:29 PM  Result Value Ref Range   Glucose-Capillary 100 (H) 70 - 99 mg/dL   Comment 1 Notify RN    Comment 2 Document in Chart   Glucose, capillary     Status: None   Collection Time: 08/02/19 11:56 PM  Result Value Ref Range   Glucose-Capillary 97 70 - 99 mg/dL   Comment 1 Notify RN    Comment 2 Document in Chart   I-STAT 7, (LYTES, BLD GAS, ICA, H+H)     Status: Abnormal   Collection Time: 08/03/19  4:02 AM  Result Value Ref Range   pH, Arterial 7.413 7.350 - 7.450   pCO2 arterial 30.1 (L) 32.0 - 48.0 mmHg   pO2, Arterial 87.0 83.0 - 108.0 mmHg   Bicarbonate 19.2 (L) 20.0 - 28.0 mmol/L   TCO2 20 (L) 22 - 32 mmol/L   O2 Saturation 97.0 %   Acid-base deficit 4.0 (H) 0.0 - 2.0 mmol/L   Sodium 138 135 - 145 mmol/L   Potassium 3.9 3.5 - 5.1 mmol/L   Calcium, Ion 1.14 (L) 1.15 - 1.40 mmol/L   HCT 34.0 (L) 36.0 - 46.0 %   Hemoglobin 11.6 (L) 12.0 - 15.0 g/dL   Patient temperature 98.6 F    Collection site RADIAL, ALLEN'S TEST ACCEPTABLE    Drawn by Operator    Sample type ARTERIAL   Glucose, capillary     Status: None   Collection Time: 08/03/19   4:08 AM  Result Value Ref Range   Glucose-Capillary 92 70 - 99 mg/dL   Comment 1 Notify RN    Comment 2 Document in Chart   Glucose, capillary     Status: Abnormal   Collection Time: 08/03/19  7:52 AM  Result Value Ref Range   Glucose-Capillary 121 (H) 70 - 99 mg/dL  CBC     Status: Abnormal   Collection Time: 08/03/19  8:00 AM  Result Value Ref Range   WBC 16.3 (H) 4.0 - 10.5 K/uL   RBC 3.71 (L) 3.87 - 5.11 MIL/uL   Hemoglobin 11.3 (L) 12.0 - 15.0 g/dL   HCT 34.6 (L) 36.0 - 46.0 %  MCV 93.3 80.0 - 100.0 fL   MCH 30.5 26.0 - 34.0 pg   MCHC 32.7 30.0 - 36.0 g/dL   RDW 13.3 11.5 - 15.5 %   Platelets 130 (L) 150 - 400 K/uL   nRBC 0.0 0.0 - 0.2 %    Comment: Performed at [facility] [address], [place], [place] [address]    Recent Results (from the past 240 hour(s))  Respiratory Panel by RT PCR (Flu A&B, Covid) - Nasopharyngeal Swab     Status: Abnormal   Collection Time: 08/02/19 12:18 AM   Specimen: Nasopharyngeal Swab  Result Value Ref Range Status   SARS Coronavirus 2 by RT PCR POSITIVE (A) NEGATIVE Final    Comment: RESULT CALLED TO, READ BACK BY AND VERIFIED WITH: T. JORDAN,RN 0145 08/02/2019 T. TYSOR (NOTE) SARS-CoV-2 target nucleic acids are DETECTED. SARS-CoV-2 RNA is generally detectable in upper respiratory specimens  during the acute phase of infection. Positive results are indicative of the presence of the identified virus, but do not rule out bacterial infection or co-infection with other pathogens not detected by the test. Clinical correlation with patient history and other diagnostic information is necessary to determine patient infection status. The expected result is Negative. Fact Sheet for Patients:  PinkCheek.be Fact Sheet for Healthcare Providers: GravelBags.it This test is not yet approved or cleared by the [place] FDA and  has been authorized for detection and/or diagnosis of  SARS-CoV-2 by FDA under an Emergency Use Authorization (EUA).  This EUA will remain in effect (meaning this test can be used)  for the duration of  the COVID-19 declaration under Section 564(b)(1) of the Act, 21 U.S.C. section 360bbb-3(b)(1), unless the authorization is terminated or revoked sooner.    Influenza A by PCR NEGATIVE NEGATIVE Final   Influenza B by PCR NEGATIVE NEGATIVE Final    Comment: (NOTE) The Xpert Xpress SARS-CoV-2/FLU/RSV assay is intended as an aid in  the diagnosis of influenza from Nasopharyngeal swab specimens and  should not be used as a sole basis for treatment. Nasal washings and  aspirates are unacceptable for Xpert Xpress SARS-CoV-2/FLU/RSV  testing. Fact Sheet for Patients: PinkCheek.be Fact Sheet for Healthcare Providers: GravelBags.it This test is not yet approved or cleared by the [place] FDA and  has been authorized for detection and/or diagnosis of SARS-CoV-2 by  FDA under an Emergency Use Authorization (EUA). This EUA will remain  in effect (meaning this test can be used) for the duration of the  Covid-19 declaration under Section 564(b)(1) of the Act, 21  U.S.C. section 360bbb-3(b)(1), unless the authorization is  terminated or revoked. Performed at [facility], [place] [address], [place], [place] [address]   Urine culture     Status: Abnormal (Preliminary result)   Collection Time: 08/02/19  2:50 AM   Specimen: Urine, Catheterized  Result Value Ref Range Status   Specimen Description URINE, CATHETERIZED  Final   Special Requests NONE  Final   Culture (A)  Final    50,000 COLONIES/mL GRAM NEGATIVE RODS IDENTIFICATION AND SUSCEPTIBILITIES TO FOLLOW Performed at [facility], [place] [address], [place],  [address]    Report Status PENDING  Incomplete  Culture, respiratory (non-expectorated)     Status: None (Preliminary result)   Collection Time: 08/02/19  4:38 AM    Specimen: Tracheal Aspirate; Respiratory  Result Value Ref Range Status   Specimen Description TRACHEAL ASPIRATE  Final   Special Requests NONE  Final  Gram Stain   Final    FEW WBC PRESENT, PREDOMINANTLY PMN MODERATE GRAM POSITIVE COCCI IN PAIRS IN CHAINS IN CLUSTERS FEW GRAM NEGATIVE RODS FEW GRAM POSITIVE RODS Performed at [facility], [place] [address], [place], [place] [address]    Culture PENDING  Incomplete   Report Status PENDING  Incomplete  MRSA PCR Screening     Status: Abnormal   Collection Time: 08/02/19  9:20 AM   Specimen: Nasal Mucosa; Nasopharyngeal  Result Value Ref Range Status   MRSA by PCR (A) NEGATIVE Final    INVALID, UNABLE TO DETERMINE THE PRESENCE OF TARGET DUE TO SPECIMEN INTEGRITY. RECOLLECTION REQUESTED.    Comment:        The GeneXpert MRSA Assay (FDA approved for NASAL specimens only), is one component of a comprehensive MRSA colonization surveillance program. It is not intended to diagnose MRSA infection nor to guide or monitor treatment for MRSA infections. Results Called toMeryl Crutch RN, AT [phone] 08/02/19 BY NF Performed at [facility], [place] [address] [place], [place] [address]     Lipid Panel Recent Labs    08/02/19 0521  CHOL 136  TRIG 66  HDL 43  CHOLHDL 3.2  VLDL 13  LDLCALC 80    Studies/Results: CT HEAD WO CONTRAST  Addendum Date: 08/03/2019   ADDENDUM REPORT: 08/03/2019 01:00 ADDENDUM: These results were called by telephone at the time of interpretation on 08/03/2019 at 1:00 am to provider Dr. Jeannene Patella, Who verbally acknowledged these results. Electronically Signed   By: Lovena Le M.D.   On: 08/03/2019 01:00   Result Date: 08/03/2019 CLINICAL DATA:  Anoxic brain injury post cardiac arrest EXAM: CT HEAD WITHOUT CONTRAST TECHNIQUE: Contiguous axial images were obtained from the base of the skull through the vertex without intravenous contrast. COMPARISON:  None. FINDINGS: Brain: Evaluation of the parenchyma is  limited due to extensive streak artifact from EEG placement. There is some discernible preservation of the gray-white differentiation and the brain lacks distinct features of cerebral edema. However there is a geographic hypoattenuating region in the mesial left temporal lobe and insula which is worrisome for acute/subacute infarct in the giving clinical setting. No extra-axial hemorrhage or fluid collection is clearly evident. Vascular: Atherosclerotic calcification of the carotid siphons. No hyperdense vessel. Skull: Hyperostosis frontalis interna is present. Sinuses/Orbits: Thickening throughout the sinuses with secretions in the posterior nasopharynx which may be related to intubation seen on scout films. Mastoid air cells are well aerated. Orbits are unremarkable aside from a disc conjugate gaze which can be seen with unconsciousness/sedation. Other: None IMPRESSION: Limited study due to extensive streak artifact from EEG placement. Region of hypoattenuation in the mesial left temporal lobe and insula is concerning for acute infarct in the setting of cardiovascular resuscitation. No convincing features of global cerebral anoxic injury or cerebral edema. Intracranial atherosclerosis. Intubation with secretions in the posterior nasopharynx and sinus thickening. Electronically Signed: By: Lovena Le M.D. On: 08/03/2019 00:44   DG Chest Port 1 View  Result Date: 08/03/2019 CLINICAL DATA:  COVID pneumonia EXAM: PORTABLE CHEST 1 VIEW COMPARISON:  08/02/2019 FINDINGS: The endotracheal tube is 3.9 cm above the carina. The NG tube is coursing down the esophagus and into the stomach. Persistent bilateral infiltrates. No definite pleural effusions or pneumothorax. IMPRESSION: Stable support apparatus. Persistent bilateral infiltrates. Electronically Signed   By: Marijo Sanes M.D.   On: 08/03/2019 05:08   DG Chest Portable 1 View  Result Date: 08/02/2019 CLINICAL DATA:  Tube placement EXAM: PORTABLE CHEST 1 VIEW  COMPARISON:  Radiograph March 02, 2019 FINDINGS: *Endotracheal tube in the mid trachea, 4.2 cm from the carina. *Transesophageal tube tip and side port below the GE junction, beyond the level of imaging. *Multiple telemetry leads and monitoring devices overlie the chest. *Defibrillator pad over the left chest wall. Patchy opacities are present throughout both lungs more focally in the right upper lobe and infrahilar lung. No visible pneumothorax or effusion. Cardiac silhouette is borderline enlarged though may be accentuated by portable supine technique. The aorta is calcified. The remaining cardiomediastinal contours are unremarkable. No acute osseous or soft tissue abnormality. IMPRESSION: Satisfactory positioning of lines and tubes. Bilateral airspace opacities, right greater than left, could reflect edema, pneumonia or ARDS. Electronically Signed   By: Lovena Le M.D.   On: 08/02/2019 00:36   ECHOCARDIOGRAM COMPLETE  Result Date: 08/02/2019   ECHOCARDIOGRAM REPORT   Patient Name:   TISHIA MAESTRE Cendejas Date of Exam: 08/02/2019 Medical Rec #:  [id number]          Height:       68.0 in Accession #:    [id number]         Weight:       330.0 lb Date of Birth:  02/24/1946          BSA:          2.53 m Patient Age:    74 years           BP:           113/60 mmHg Patient Gender: F                  HR:           85 bpm. Exam Location:  Inpatient Procedure: 2D Echo, Color Doppler and Cardiac Doppler Indications:    Cardiac Arrest i46.9  History:        Patient has prior history of Echocardiogram examinations, most                 recent 07/19/2014. CHF, COPD, Arrythmias:LBBB; Risk Factors:Sleep                 Apnea and Hypertension. COVID+ 08/02/19.  Sonographer:    Raquel Sarna Senior RDCS Referring Phys: [id number] [place]  1. Left ventricular ejection fraction, by visual estimation, is 55 to 60%. The left ventricle has normal function. There is moderately increased left ventricular hypertrophy.  2. Left  ventricular diastolic parameters are consistent with Grade I diastolic dysfunction (impaired relaxation).  3. The left ventricle has no regional wall motion abnormalities.  4. Global right ventricle has normal systolic function.The right ventricular size is normal. No increase in right ventricular wall thickness.  5. Left atrial size was normal.  6. Right atrial size was normal.  7. Small pericardial effusion.  8. The pericardial effusion is circumferential.  9. The mitral valve is normal in structure. No evidence of mitral valve regurgitation. No evidence of mitral stenosis. 10. The tricuspid valve is normal in structure. 11. The tricuspid valve is normal in structure. Tricuspid valve regurgitation is not demonstrated. 12. The aortic valve was not well visualized. Aortic valve regurgitation is not visualized. No evidence of aortic valve sclerosis or stenosis. 13. The pulmonic valve was not well visualized. Pulmonic valve regurgitation is not visualized. 14. The inferior vena cava is dilated in size with <50% respiratory variability, suggesting right atrial pressure of 15 mmHg. 15. The interatrial septum  was not well visualized. FINDINGS  Left Ventricle: Left ventricular ejection fraction, by visual estimation, is 55 to 60%. The left ventricle has normal function. The left ventricle has no regional wall motion abnormalities. There is moderately increased left ventricular hypertrophy. Left ventricular diastolic parameters are consistent with Grade I diastolic dysfunction (impaired relaxation). Normal left atrial pressure. Right Ventricle: The right ventricular size is normal. No increase in right ventricular wall thickness. Global RV systolic function is has normal systolic function. Left Atrium: Left atrial size was normal in size. Right Atrium: Right atrial size was normal in size Pericardium: A small pericardial effusion is present. The pericardial effusion is circumferential. Mitral Valve: The mitral valve is  normal in structure. No evidence of mitral valve regurgitation. No evidence of mitral valve stenosis by observation. Tricuspid Valve: The tricuspid valve is normal in structure. Tricuspid valve regurgitation is not demonstrated. Aortic Valve: The aortic valve was not well visualized. Aortic valve regurgitation is not visualized. The aortic valve is structurally normal, with no evidence of sclerosis or stenosis. Aortic valve mean gradient measures 6.6 mmHg. Aortic valve peak gradient measures 11.9 mmHg. Aortic valve area, by VTI measures 1.77 cm. Pulmonic Valve: The pulmonic valve was not well visualized. Pulmonic valve regurgitation is not visualized. Pulmonic regurgitation is not visualized. No evidence of pulmonic stenosis. Aorta: The aortic root is normal in size and structure. Pulmonary Artery: Indeterminant PASP, inadequate TR jet. Venous: The inferior vena cava is dilated in size with less than 50% respiratory variability, suggesting right atrial pressure of 15 mmHg. IAS/Shunts: The interatrial septum was not well visualized.  LEFT VENTRICLE PLAX 2D LVIDd:         3.80 cm  Diastology LVIDs:         2.80 cm  LV e' lateral:   4.35 cm/s LV PW:         1.30 cm  LV E/e' lateral: 13.8 LV IVS:        1.20 cm  LV e' medial:    4.68 cm/s LVOT diam:     1.90 cm  LV E/e' medial:  12.9 LV SV:         32 ml LV SV Index:   11.72 LVOT Area:     2.84 cm  RIGHT VENTRICLE RV S prime:     13.40 cm/s TAPSE (M-mode): 2.1 cm LEFT ATRIUM             Index       RIGHT ATRIUM           Index LA diam:        4.00 cm 1.58 cm/m  RA Area:     11.00 cm LA Vol (A2C):   54.4 ml 21.52 ml/m RA Volume:   20.90 ml  8.27 ml/m LA Vol (A4C):   49.1 ml 19.42 ml/m LA Biplane Vol: 55.0 ml 21.75 ml/m  AORTIC VALVE AV Area (Vmax):    1.64 cm AV Area (Vmean):   1.36 cm AV Area (VTI):     1.77 cm AV Vmax:           172.59 cm/s AV Vmean:          122.080 cm/s AV VTI:            0.245 m AV Peak Grad:      11.9 mmHg AV Mean Grad:      6.6 mmHg  LVOT Vmax:         100.00 cm/s LVOT Vmean:  58.600 cm/s LVOT VTI:          0.153 m LVOT/AV VTI ratio: 0.62  AORTA Ao Root diam: 2.80 cm Ao Asc diam:  3.10 cm MITRAL VALVE MV Area (PHT): 3.83 cm              SHUNTS MV PHT:        57.42 msec            Systemic VTI:  0.15 m MV Decel Time: 198 msec              Systemic Diam: 1.90 cm MV E velocity: 60.20 cm/s  103 cm/s MV A velocity: 104.00 cm/s 70.3 cm/s MV E/A ratio:  0.58        1.5  Carlyle Dolly MD Electronically signed by Carlyle Dolly MD Signature Date/Time: 08/02/2019/4:37:24 PM    Final     Medications:  Scheduled: . chlorhexidine  15 mL Mouth Rinse BID  . chlorhexidine gluconate (MEDLINE KIT)  15 mL Mouth Rinse BID  . Chlorhexidine Gluconate Cloth  6 each Topical Daily  . dexamethasone (DECADRON) injection  6 mg Intravenous Q24H  . heparin  5,000 Units Subcutaneous Q8H  . ipratropium-albuterol  3 mL Nebulization Q6H  . mouth rinse  15 mL Mouth Rinse 10 times per day  . pantoprazole (PROTONIX) IV  40 mg Intravenous QHS   Continuous: . epinephrine Stopped (08/02/19 0422)  . levETIRAcetam Stopped (08/03/19 0319)  . levofloxacin (LEVAQUIN) IV    . remdesivir 100 mg in NS 100 mL     CT head: -- Region of hypoattenuation in the mesial left temporal lobe and insula is concerning for acute infarct in the setting of cardiovascular resuscitation. -- No convincing features of global cerebral anoxic injury or cerebral edema. -- Intracranial atherosclerosis.  Assessment:  74 year old Covid-positive female with dilated cardiomyopathy, moderate dilated LA, nonobstructive CAD, OSA, COPD, hypertension and cervical cancer, brought in with witnessed cardiac arrests x 3 requiring CPR and total downtime of about 30 minutes prior to ROSC. In the ICU, upon reducing propofol sedation, she exhibited whole body myoclonic jerking.   1. Overall presentation most consistent with diffuse anoxic brain injury. Clinically, presence of myoclonus  early in the course of post-cardiac arrest usually portends a poor prognosis for a neurologically meaningful recovery. 2. Possible component of severe toxic metabolic encephalopathy-deranged renal and liver function. 3. CT head with region of hypoattenuation in the mesial left temporal lobe and insula, which is concerning for acute infarct in the setting of cardiovascular resuscitation.Although there are no convincing features of global cerebral anoxic injury or cerebral edema, anoxic brain injury may present with a negative CT.  4. LTM EEG: No seizures or epileptiform discharges. Continuous slowing is suggestive of encephalopathy.   Recommendations: 1. Discontinuing LTM EEG 2. Continue Keppra 500 mg twice daily 3. If myoclonic jerking recurs, consider adding Klonopin.  I am hesitant to add Depakote due to her deranged liver function. 4. MRI brain   35 minutes spent in the neurological evaluation and management of this critically ill patient. Time spent included coordination of care.    LOS: 1 day   _0  signed: Dr. Kerney Elbe 08/03/2019  8:37 AM

## 2019-08-03 NOTE — Progress Notes (Signed)
LTM EEG discontinued - no skin breakdown at unhook.   

## 2019-08-03 NOTE — Progress Notes (Signed)
eLink Physician-Brief Progress Note Patient Name: Melody Flores DOB: 1946/04/20 MRN: [id number]   Date of Service  08/03/2019  HPI/Events of Note  Hypertension, SBP 170. Noted /acute stroke on CT. Will give labetalol if SBP > 180. Modify the goal based on hemidynamics and further progress today   eICU Interventions  As above      Intervention Category Intermediate Interventions: Hypertension - evaluation and management  Margaretmary Lombard 08/03/2019, 4:32 AM

## 2019-08-03 NOTE — Progress Notes (Signed)
200 mL Fentanyl (2538mcg in NS 25mL) wasted in waste sink, witnessed by [place], Therapist, sports.

## 2019-08-03 NOTE — Progress Notes (Signed)
NAME:  Melody Flores, MRN:  [id number], DOB:  Jul 03, 1945, LOS: 1 ADMISSION DATE:  07/12/2019, CONSULTATION DATE:  08/03/19 REFERRING MD:  Roxanne Mins, CHIEF COMPLAINT:  Cardiac arrest   Brief History   74 y.o. F with PMH of COPD and dilated cardiomyopathy who complained of shortness of breath yesterday.  Family called EMS and pt had a witnessed PEA/Vtach arrest with ROSC three times.  Intubated in the ED with infiltrates on CXR.  Covid-19 positive  History of present illness   74 y.o. F with PMH of dilated cardiomyopathy with EF of 20-25%, obesity and OSA, COPD, HTN and obesity who complained of shortness of breath today.  Family called EMS and patient had witnessed PEA arrest with ROSC x2, at some point was in [place] and given amiodarone and defibrillated.  She then loss of pulses again with CPR in progress on arrival.  Per report, pt received total of 8mg  Epinephrine and then placed on Epi gtt.   Work-up in the ED significant for bilateral airspace opacities on CXR, WBC 22k, lactic acid 8.9, Creatinine 1.8, LBBB on EKG of unclear chronicity with troponin of 29.   Blood pressure was stable and patient was given levaquin.  Covid-19 test resulted positive.  Spoke with patient's granddaughter [name] who lives with her, she notes several family members with URI sx's recently, but had tested negative for Covid-19.  Past Medical History   has a past medical history of Acute gouty arthritis, Anemia, Arthritis, Bilateral edema of lower extremity, Cancer (HCC), CHF (congestive heart failure) (HCC), Chronic combined systolic and diastolic heart failure (HCC), COPD (chronic obstructive pulmonary disease) ([place]), Dilated cardiomyopathy (HCC), Gastroesophageal reflux disease, Gout, Headache, Heart murmur, Hypertension, LBBB (left bundle branch block), Morbid obesity with alveolar hypoventilation ([place]), Pneumonia, and Sleep apnea.   Significant Hospital Events   1/31 Admit to PCCM  Consults:   cardiology  Procedures:  1/31 ETT 1/31 R femoral CVC placed by EDP  Significant Diagnostic Tests:  1/31 CXR>>Bilateral airspace opacities, right greater than left, could reflect edema, pneumonia or ARDS. 1/31 CT head>> 1/31 CTA chest>>  Micro Data:  1/31 Sars-CoV-2>>positive 1/31 blood cultures x2>> 1/31 urine culture  Antimicrobials:  Levaquin 1/31- Remdesivir 1/31-  Interim history/subjective:   Critically ill remains intubated on mechanical life support.  EEG completed diffuse encephalopathy.  Unfortunately still remains unresponsive off sedation, no cough no gag no purposeful movements no response to pain.  Objective   Blood pressure (!) 172/88, pulse 96, temperature 99.1 F (37.3 C), resp. rate 19, height 5\' 8"  (1.727 m), weight (!) 144 kg, SpO2 100 %.    Vent Mode: PRVC FiO2 (%):  [40 %-50 %] 40 % Set Rate:  [12 bmp] 12 bmp Vt Set:  [530 mL] 530 mL PEEP:  [8 cmH20] 8 cmH20 Plateau Pressure:  [22 cmH20-26 cmH20] 22 cmH20   Intake/Output Summary (Last 24 hours) at 08/03/2019 1213 Last data filed at 08/03/2019 0600 Gross per 24 hour  Intake 384.14 ml  Output 150 ml  Net 234.14 ml   Filed Weights   08/02/19 0015 08/03/19 0443  Weight: (!) 149.7 kg (!) 144 kg    General appearance: 74 y.o., female, debated critically on mechanical life support Eyes: Pupils are reactive however patient has a fixed upward gaze HENT: NCAT, endotracheal tube in place Neck: Trachea midline, endotracheal tube in place Lungs: Bilateral ventilated breath sounds CV: Regular rate and rhythm, S1-S2 Abdomen: Soft, non-tender; non-distended, BS present  Extremities: Bilateral lower extremity Skin: Normal temperature,  turgor and texture; no rash Neuro: No cough no gag no response to pain, pupils are responsive has a fixed upward gaze.   CXR: Persistent bilateral infiltrates.  Endotracheal tube in place. The patient's images have been independently reviewed by me.    Resolved Hospital  Problem list     Assessment & Plan:   Cardiopulmonary arrest  likely secondary to acute Covid-19 infection and Pneumonia -Final ROSC greater than 30 minutes from initial PEA arrest, not a candidate for TTM -Cardiology consulted in the ED -Last echocardiogram 2016 with EF of 20 to 25% -L BBB on EKG of unclear chronicity with minimally elevated troponin P: -Continue remdesivir plus dexamethasone usual course. -Unable to obtain CT due to elevated creatinine Maintain normothermia postarrest. Echocardiogram ordered Blood cultures pending no growth to date Wean FiO2 and PEEP as tolerated to maintain his sats above 88%.  Acute encephalopathy -Received Versed for RSI Prolonged cardiac arrest state, At risk for anoxic brain injury EEG with diffuse encephalopathy  P: -MRI pending -Discussed prognosis with patient's granddaughter via phone  [place], acute renal failure -Likely secondary to lactic acidosis P: Likely ATN secondary to shock state and cardiac arrest. Due to concern for anoxic brain injury I do not believe patient would be candidate for renal replacement therapy.  BUN only 35 and unlikely to be playing a component in setting of encephalopathy.  COPD -Not on home O2, no PFTs in system, no recent hospitalizations for exacerbation P: -Continue duo nebs plus Levaquin  Dilated cardiomyopathy -Last EF in 2016 was 20-25% P: Holding home dose beta-blocker and Entresto Just an additional comorbidity to the patient's multiple medical problems at this time  Goals of care: Discussed current status of the patient with patient's granddaughter.  She is going to speak with patient's sister.  They understand that she has a poor prognosis related to her prolonged cardiac arrest in the setting of COVID-19 pneumonia now with progressive renal failure.  Suspect they will pursue palliation.  Decision made for CODE STATUS changed to DNR.  Best practice:  Diet: N.p.o. Pain/Anxiety/Delirium  protocol (if indicated): Fentanyl VAP protocol (if indicated): Yes DVT prophylaxis: Heparin GI prophylaxis: Protonix Glucose control: SSI Mobility: Bedrest Code Status: Full code confirmed Family Communication: I have updated patient's granddaughter [name]. Disposition: ICU  Labs   CBC: Recent Labs  Lab 08/02/19 0005 08/02/19 0005 08/02/19 0044 08/02/19 0521 08/02/19 0524 08/03/19 0402 08/03/19 0800  WBC 22.1*  --   --  11.3*  --   --  16.3*  NEUTROABS 15.1*  --   --   --   --   --   --   HGB 9.9*   < > 10.9* 11.2* 12.2 11.6* 11.3*  HCT 33.3*   < > 32.0* 36.0 36.0 34.0* 34.6*  MCV 104.7*  --   --  98.1  --   --  93.3  PLT 97*  --   --  137*  --   --  130*   < > = values in this interval not displayed.    Basic Metabolic Panel: Recent Labs  Lab 08/02/19 0005 08/02/19 0005 08/02/19 0044 08/02/19 0521 08/02/19 0524 08/03/19 0402 08/03/19 0800  NA 139   < > 140 140 140 138 140  K 4.8   < > 4.6 4.0 3.9 3.9 4.0  CL 106  --   --  106  --   --  107  CO2 17*  --   --  22  --   --  19*  GLUCOSE 167*  --   --  163*  --   --  134*  BUN 14  --   --  18  --   --  35*  CREATININE 1.86*  --   --  2.05*  --   --  4.17*  CALCIUM 8.0*  --   --  8.3*  --   --  8.1*  MG  --   --   --  1.9  --   --  2.0  PHOS  --   --   --   --   --   --  3.6   < > = values in this interval not displayed.   GFR: Estimated Creatinine Clearance: 17.9 mL/min (A) (by C-G formula based on SCr of 4.17 mg/dL (H)). Recent Labs  Lab 08/02/19 0005 08/02/19 0521 08/03/19 0800  WBC 22.1* 11.3* 16.3*  LATICACIDVEN 8.9*  --   --     Liver Function Tests: Recent Labs  Lab 08/02/19 0005 08/02/19 0521  AST 105* 159*  ALT 54* 72*  ALKPHOS 104 120  BILITOT 0.5 1.0  PROT 6.5 7.2  ALBUMIN 2.4* 2.7*   No results for input(s): LIPASE, AMYLASE in the last 168 hours. No results for input(s): AMMONIA in the last 168 hours.  ABG    Component Value Date/Time   PHART 7.413 08/03/2019 0402   PCO2ART  30.1 (L) 08/03/2019 0402   PO2ART 87.0 08/03/2019 0402   HCO3 19.2 (L) 08/03/2019 0402   TCO2 20 (L) 08/03/2019 0402   ACIDBASEDEF 4.0 (H) 08/03/2019 0402   O2SAT 97.0 08/03/2019 0402     Coagulation Profile: Recent Labs  Lab 08/02/19 0005  INR 1.4*    Cardiac Enzymes: No results for input(s): CKTOTAL, CKMB, CKMBINDEX, TROPONINI in the last 168 hours.  HbA1C: Hgb A1c MFr Bld  Date/Time Value Ref Range Status  08/02/2019 05:21 AM 5.6 4.8 - 5.6 % Final    Comment:    (NOTE) Pre diabetes:          5.7%-6.4% Diabetes:              >6.4% Glycemic control for   <7.0% adults with diabetes     CBG: Recent Labs  Lab 08/02/19 2029 08/02/19 2356 08/03/19 0408 08/03/19 0752 08/03/19 1133  GLUCAP 100* 97 92 121* 109*     This patient is critically ill with multiple organ system failure; which, requires frequent high complexity decision making, assessment, support, evaluation, and titration of therapies. This was completed through the application of advanced monitoring technologies and extensive interpretation of multiple databases. During this encounter critical care time was devoted to patient care services described in this note for 34 minutes.  Garner Nash, DO [facility] 08/03/2019 12:14 PM

## 2019-08-03 NOTE — Progress Notes (Signed)
eLink Physician-Brief Progress Note Patient Name: LACHANDRA RESCH DOB: 07/21/1945 MRN: [id number]   Date of Service  08/03/2019  HPI/Events of Note  Call received from radiology about CT head findings  eICU Interventions  Neurology to be notified     Intervention Category Minor Interventions: Communication with other healthcare providers and/or family  Margaretmary Lombard 08/03/2019, 1:07 AM

## 2019-08-03 DEATH — deceased

## 2019-08-04 LAB — PHOSPHORUS: Phosphorus: 4.9 mg/dL — ABNORMAL HIGH (ref 2.5–4.6)

## 2019-08-04 LAB — URINE CULTURE: Culture: 50000 — AB

## 2019-08-04 LAB — GLUCOSE, CAPILLARY
Glucose-Capillary: 99 mg/dL (ref 70–99)
Glucose-Capillary: 91 mg/dL (ref 70–99)

## 2019-08-04 LAB — CULTURE, RESPIRATORY W GRAM STAIN: Culture: NORMAL

## 2019-08-04 LAB — MAGNESIUM: Magnesium: 2 mg/dL (ref 1.7–2.4)

## 2019-08-04 MED ORDER — ACETAMINOPHEN 650 MG RE SUPP: 650.0000 mg | Freq: Four times a day (QID) | RECTAL | Status: DC | PRN

## 2019-08-04 MED ORDER — GLYCOPYRROLATE 1 MG PO TABS: 1.0000 mg | ORAL_TABLET | ORAL | Status: DC | PRN

## 2019-08-04 MED ORDER — GLYCOPYRROLATE 0.2 MG/ML IJ SOLN
0.2000 mg | INTRAMUSCULAR | Status: DC | PRN
  Administered 2019-08-05: 0.2 mg via SUBCUTANEOUS
  Filled 2019-08-04: qty 1

## 2019-08-04 MED ORDER — MIDAZOLAM 50MG/50ML (1MG/ML) PREMIX INFUSION
0.0000 mg/h | INTRAVENOUS | Status: DC
  Administered 2019-08-04: 8 mg/h via INTRAVENOUS
  Administered 2019-08-04: 1 mg/h via INTRAVENOUS
  Administered 2019-08-05: 8 mg/h via INTRAVENOUS
  Filled 2019-08-04 (×3): qty 50

## 2019-08-04 MED ORDER — MORPHINE SULFATE (PF) 2 MG/ML IV SOLN
2.0000 mg | INTRAVENOUS | Status: AC | PRN
  Administered 2019-08-04 (×3): 2 mg via INTRAVENOUS
  Filled 2019-08-04: qty 1

## 2019-08-04 MED ORDER — MIDAZOLAM HCL 2 MG/2ML IJ SOLN: 1.0000 mg | INTRAMUSCULAR | Status: DC | PRN

## 2019-08-04 MED ORDER — ACETAMINOPHEN 325 MG PO TABS
650.0000 mg | ORAL_TABLET | Freq: Four times a day (QID) | ORAL | Status: DC | PRN
  Administered 2019-08-04: 650 mg via ORAL
  Filled 2019-08-04: qty 2

## 2019-08-04 MED ORDER — DIPHENHYDRAMINE HCL 50 MG/ML IJ SOLN: 25.0000 mg | INTRAMUSCULAR | Status: DC | PRN

## 2019-08-04 MED ORDER — GLYCOPYRROLATE 0.2 MG/ML IJ SOLN: 0.2000 mg | INTRAMUSCULAR | Status: DC | PRN

## 2019-08-04 MED ORDER — DEXTROSE 5 % IV SOLN: INTRAVENOUS | Status: DC

## 2019-08-04 MED ORDER — MORPHINE 100MG IN NS 100ML (1MG/ML) PREMIX INFUSION
0.0000 mg/h | INTRAVENOUS | Status: DC
  Administered 2019-08-04: 5 mg/h via INTRAVENOUS
  Administered 2019-08-04 – 2019-08-05 (×3): 20 mg/h via INTRAVENOUS
  Filled 2019-08-04 (×3): qty 100

## 2019-08-04 MED ORDER — MIDAZOLAM BOLUS VIA INFUSION (WITHDRAWAL LIFE SUSTAINING TX)
2.0000 mg | INTRAVENOUS | Status: DC | PRN
  Administered 2019-08-04 (×2): 2 mg via INTRAVENOUS
  Filled 2019-08-04: qty 2

## 2019-08-04 MED ORDER — MORPHINE BOLUS VIA INFUSION
5.0000 mg | INTRAVENOUS | Status: DC | PRN
  Administered 2019-08-04 – 2019-08-05 (×5): 5 mg via INTRAVENOUS
  Filled 2019-08-04: qty 5

## 2019-08-04 MED ORDER — POLYVINYL ALCOHOL 1.4 % OP SOLN
1.0000 [drp] | Freq: Four times a day (QID) | OPHTHALMIC | Status: DC | PRN
  Filled 2019-08-04: qty 15

## 2019-08-04 NOTE — Progress Notes (Signed)
PCCM:  I called and spoke with Melody Flores following family visit.  Decision made for transition to comfort care.  Withdrawal of life-sustaining treatment order set placed.  Comfort care measures instituted.  Goal of care focused on comfort alone.  I suspect patient will pass here within the intensive care unit.  Garner Nash, DO [facility] 08/04/2019 4:13 PM

## 2019-08-04 NOTE — Progress Notes (Signed)
NAME:  Melody Flores, MRN:  [id number], DOB:  1946-01-17, LOS: 2 ADMISSION DATE:  07/04/2019, CONSULTATION DATE:  08/04/19 REFERRING MD:  Roxanne Mins, CHIEF COMPLAINT:  Cardiac arrest   Brief History   74 y.o. F with PMH of COPD and dilated cardiomyopathy who complained of shortness of breath yesterday.  Family called EMS and pt had a witnessed PEA/Vtach arrest with ROSC three times.  Intubated in the ED with infiltrates on CXR.  Covid-19 positive  History of present illness   74 y.o. F with PMH of dilated cardiomyopathy with EF of 20-25%, obesity and OSA, COPD, HTN and obesity who complained of shortness of breath today.  Family called EMS and patient had witnessed PEA arrest with ROSC x2, at some point was in [place] and given amiodarone and defibrillated.  She then loss of pulses again with CPR in progress on arrival.  Per report, pt received total of 8mg  Epinephrine and then placed on Epi gtt.   Work-up in the ED significant for bilateral airspace opacities on CXR, WBC 22k, lactic acid 8.9, Creatinine 1.8, LBBB on EKG of unclear chronicity with troponin of 29.   Blood pressure was stable and patient was given levaquin.  Covid-19 test resulted positive.  Spoke with patient's granddaughter [name] who lives with her, she notes several family members with URI sx's recently, but had tested negative for Covid-19.  Past Medical History   has a past medical history of Acute gouty arthritis, Anemia, Arthritis, Bilateral edema of lower extremity, Cancer (HCC), CHF (congestive heart failure) (HCC), Chronic combined systolic and diastolic heart failure (HCC), COPD (chronic obstructive pulmonary disease) ([place]), Dilated cardiomyopathy (HCC), Gastroesophageal reflux disease, Gout, Headache, Heart murmur, Hypertension, LBBB (left bundle branch block), Morbid obesity with alveolar hypoventilation ([place]), Pneumonia, and Sleep apnea.   Significant Hospital Events   1/31 Admit to PCCM  Consults:   cardiology  Procedures:  1/31 ETT 1/31 R femoral CVC placed by EDP  Significant Diagnostic Tests:  1/31 CXR>>Bilateral airspace opacities, right greater than left, could reflect edema, pneumonia or ARDS. 1/31 CT head>> 1/31 CTA chest>> canceled 08/03/2019 MRI brain: Severe anoxic brain injury  Micro Data:  1/31 Sars-CoV-2>>positive 1/31 blood cultures x2>> 1/31 urine culture  Antimicrobials:  Levaquin 1/31- Remdesivir 1/31-  Interim history/subjective:   MRI completed overnight. Severe anoxic brain injury.  I discussed the results with the patient's granddaughter.  She is going to also speak with the remainder of the family.  They plan to come to the hospital today for visitation and withdrawal of care.  Objective   Blood pressure 136/78, pulse 96, temperature (!) 100.4 F (38 C), resp. rate (!) 23, height 5\' 8"  (1.727 m), weight (!) 144 kg, SpO2 100 %.    Vent Mode: PRVC FiO2 (%):  [40 %] 40 % Set Rate:  [12 bmp] 12 bmp Vt Set:  [530 mL] 530 mL PEEP:  [8 cmH20] 8 cmH20 Plateau Pressure:  [22 cmH20-25 cmH20] 25 cmH20   Intake/Output Summary (Last 24 hours) at 08/04/2019 0820 Last data filed at 08/04/2019 0400 Gross per 24 hour  Intake 4836.87 ml  Output 450 ml  Net 4386.87 ml   Filed Weights   08/02/19 0015 08/03/19 0443  Weight: (!) 149.7 kg (!) 144 kg    General appearance: 74 y.o., female, intubated critically ill Eyes: Pupils reactive, still with fixed upward gaze  HENT: NCAT, endotracheal tube in place Neck: Trachea midline, endotracheal tube in place Lungs: Lateral ventilated breath sounds CV: Regular rate and  rhythm S1-S2 Abdomen: Soft nontender nondistended bowel sounds present, obese pannus Extremities: Large extremities no significant edema Neuro: No cough, no gag no response to pain pupils are responsive with a fixed upward gaze, no spontaneous movements   MRI: Severe diffuse anoxic brain injury. The patient's images have been independently reviewed  by me.    Resolved Hospital Problem list     Assessment & Plan:   Cardiopulmonary arrest  likely secondary to acute Covid-19 infection and Pneumonia -Final ROSC greater than 30 minutes from initial PEA arrest, not a candidate for TTM -Cardiology consulted in the ED -Last echocardiogram 2016 with EF of 20 to 25% -L BBB on EKG of unclear chronicity with minimally elevated troponin P: Remains on remdesivir plus dexamethasone Following cardiac arrest and evidence of anoxic brain injury patient's family have elected to pursue withdrawal of care.  Acute metabolic encephalopathy Severe diffuse anoxic brain injury P: -Poor prognosis, this is unlikely recoverable. -Patient's family has elected to withdraw care  AGMA, acute renal failure -Likely secondary to lactic acidosis P: Continue to follow urine output Not candidate for renal replacement therapy secondary to anoxic brain injury  COPD -Not on home O2, no PFTs in system, no recent hospitalizations for exacerbation P: DuoNeb plus Levaquin  Dilated cardiomyopathy -Last EF in 2016 was 20-25% P: Holding home dose heart failure regimen  Goals of care: I had a long discussion with the patient's granddaughter [name] via the phone this morning.  They understand the patient's current clinical condition and outcome regarding her MRI results consistent with anoxic brain injury after a prolonged cardiac arrest.  The decision is made for two-person family visitation and liberation from mechanical ventilator to withdrawal care and comfort care measures.  This will take place this afternoon.  Best practice:  Diet: N.p.o. Pain/Anxiety/Delirium protocol (if indicated): Fentanyl VAP protocol (if indicated): Yes DVT prophylaxis: Heparin GI prophylaxis: Protonix Glucose control: SSI Mobility: Bedrest Code Status: Full code confirmed Family Communication: I have called and spoke with patient's granddaughter [name]. Disposition: ICU  Labs    CBC: Recent Labs  Lab 08/02/19 0005 08/02/19 0005 08/02/19 0044 08/02/19 0521 08/02/19 0524 08/03/19 0402 08/03/19 0800  WBC 22.1*  --   --  11.3*  --   --  16.3*  NEUTROABS 15.1*  --   --   --   --   --   --   HGB 9.9*   < > 10.9* 11.2* 12.2 11.6* 11.3*  HCT 33.3*   < > 32.0* 36.0 36.0 34.0* 34.6*  MCV 104.7*  --   --  98.1  --   --  93.3  PLT 97*  --   --  137*  --   --  130*   < > = values in this interval not displayed.    Basic Metabolic Panel: Recent Labs  Lab 08/02/19 0005 08/02/19 0005 08/02/19 0044 08/02/19 0521 08/02/19 0524 08/03/19 0402 08/03/19 0800 08/04/19 0400  NA 139   < > 140 140 140 138 140  --   K 4.8   < > 4.6 4.0 3.9 3.9 4.0  --   CL 106  --   --  106  --   --  107  --   CO2 17*  --   --  22  --   --  19*  --   GLUCOSE 167*  --   --  163*  --   --  134*  --   BUN 14  --   --  18  --   --  35*  --   CREATININE 1.86*  --   --  2.05*  --   --  4.17*  --   CALCIUM 8.0*  --   --  8.3*  --   --  8.1*  --   MG  --   --   --  1.9  --   --  2.0 2.0  PHOS  --   --   --   --   --   --  3.6 4.9*   < > = values in this interval not displayed.   GFR: Estimated Creatinine Clearance: 17.9 mL/min (A) (by C-G formula based on SCr of 4.17 mg/dL (H)). Recent Labs  Lab 08/02/19 0005 08/02/19 0521 08/03/19 0800 08/03/19 1309  WBC 22.1* 11.3* 16.3*  --   LATICACIDVEN 8.9*  --   --  1.3    Liver Function Tests: Recent Labs  Lab 08/02/19 0005 08/02/19 0521  AST 105* 159*  ALT 54* 72*  ALKPHOS 104 120  BILITOT 0.5 1.0  PROT 6.5 7.2  ALBUMIN 2.4* 2.7*   No results for input(s): LIPASE, AMYLASE in the last 168 hours. No results for input(s): AMMONIA in the last 168 hours.  ABG    Component Value Date/Time   PHART 7.413 08/03/2019 0402   PCO2ART 30.1 (L) 08/03/2019 0402   PO2ART 87.0 08/03/2019 0402   HCO3 19.2 (L) 08/03/2019 0402   TCO2 20 (L) 08/03/2019 0402   ACIDBASEDEF 4.0 (H) 08/03/2019 0402   O2SAT 97.0 08/03/2019 0402     Coagulation  Profile: Recent Labs  Lab 08/02/19 0005  INR 1.4*    Cardiac Enzymes: No results for input(s): CKTOTAL, CKMB, CKMBINDEX, TROPONINI in the last 168 hours.  HbA1C: Hgb A1c MFr Bld  Date/Time Value Ref Range Status  08/02/2019 05:21 AM 5.6 4.8 - 5.6 % Final    Comment:    (NOTE) Pre diabetes:          5.7%-6.4% Diabetes:              >6.4% Glycemic control for   <7.0% adults with diabetes     CBG: Recent Labs  Lab 08/03/19 1133 08/03/19 1523 08/03/19 1927 08/04/19 0339 08/04/19 0758  GLUCAP 109* 123* 90 99 91     This patient is critically ill with multiple organ system failure; which, requires frequent high complexity decision making, assessment, support, evaluation, and titration of therapies. This was completed through the application of advanced monitoring technologies and extensive interpretation of multiple databases. During this encounter critical care time was devoted to patient care services described in this note for 32 minutes.  Garner Nash, DO  Pulmonary Critical Care 08/04/2019 8:20 AM

## 2019-08-04 NOTE — Progress Notes (Signed)
Subjective: MRI brain completed overnight.   Objective: Current vital signs: BP 136/78   Pulse 96   Temp (!) 100.4 F (38 C)   Resp (!) 23   Ht '5\' 8"'$  (1.727 m)   Wt (!) 144 kg   SpO2 100%   BMI 48.27 kg/m  Vital signs in last 24 hours: Temp:  [98.2 F (36.8 C)-100.4 F (38 C)] 100.4 F (38 C) (02/02 0600) Pulse Rate:  [83-98] 96 (02/02 0600) Resp:  [15-24] 23 (02/02 0600) BP: (128-173)/(75-95) 136/78 (02/02 0600) SpO2:  [99 %-100 %] 100 % (02/02 0600) FiO2 (%):  [40 %] 40 % (02/02 0255)  Intake/Output from previous day: 02/01 0701 - 02/02 0700 In: 4836.9 [I.V.:5.2; NG/GT:843.3; IV Piggyback:3988.3] Out: 450 [Urine:375; Stool:75] Intake/Output this shift: No intake/output data recorded. Nutritional status:  Diet Order            Diet NPO time specified  Diet effective now              Neurologic Exam: Ment: No responses to any external stimuli. Eyes are partially open.  CN: Pupils 3 mm and sluggishly reactive bilaterally. No blink to threat. No eye movement towards or away from visual stimuli. Exotropia noted. Doll's eye reflex now absent. No blink to eyelid stimulation. Intermittently biting on ET tube in stereotyped fashion, but no facial myoclonus noted. No cough reflex.  Motor/Sensory: Flaccid tone x 4. No movement to noxious stimuli x 4.  Reflexes:  Unable to elicit brachioradialis or patellar reflexes.  Lab Results: Results for orders placed or performed during the hospital encounter of 07/23/2019 (from the past 48 hour(s))  Glucose, capillary     Status: Abnormal   Collection Time: 08/02/19  8:42 AM  Result Value Ref Range   Glucose-Capillary 123 (H) 70 - 99 mg/dL  MRSA PCR Screening     Status: Abnormal   Collection Time: 08/02/19  9:20 AM   Specimen: Nasal Mucosa; Nasopharyngeal  Result Value Ref Range   MRSA by PCR (A) NEGATIVE    INVALID, UNABLE TO DETERMINE THE PRESENCE OF TARGET DUE TO SPECIMEN INTEGRITY. RECOLLECTION REQUESTED.    Comment:         The GeneXpert MRSA Assay (FDA approved for NASAL specimens only), is one component of a comprehensive MRSA colonization surveillance program. It is not intended to diagnose MRSA infection nor to guide or monitor treatment for MRSA infections. Results Called toMeryl Crutch RN, AT [phone] 08/02/19 BY NF Performed at [facility], [place] [address] [place], [place] [address]   D-dimer, quantitative (not at [facility])     Status: Abnormal   Collection Time: 08/02/19 11:40 AM  Result Value Ref Range   D-Dimer, Quant >20.00 (H) 0.00 - 0.50 ug/mL-FEU    Comment: (NOTE) At the manufacturer cut-off of 0.50 ug/mL FEU, this assay has been documented to exclude PE with a sensitivity and negative predictive value of 97 to 99%.  At this time, this assay has not been approved by the FDA to exclude DVT/VTE. Results should be correlated with clinical presentation. Performed at [facility], [place] [address], [place], [place] [address]   Glucose, capillary     Status: Abnormal   Collection Time: 08/02/19 11:56 AM  Result Value Ref Range   Glucose-Capillary 114 (H) 70 - 99 mg/dL  Glucose, capillary     Status: Abnormal   Collection Time: 08/02/19  4:25 PM  Result Value Ref Range   Glucose-Capillary 106 (H) 70 -  99 mg/dL  Glucose, capillary     Status: Abnormal   Collection Time: 08/02/19  8:29 PM  Result Value Ref Range   Glucose-Capillary 100 (H) 70 - 99 mg/dL   Comment 1 Notify RN    Comment 2 Document in Chart   Glucose, capillary     Status: None   Collection Time: 08/02/19 11:56 PM  Result Value Ref Range   Glucose-Capillary 97 70 - 99 mg/dL   Comment 1 Notify RN    Comment 2 Document in Chart   I-STAT 7, (LYTES, BLD GAS, ICA, H+H)     Status: Abnormal   Collection Time: 08/03/19  4:02 AM  Result Value Ref Range   pH, Arterial 7.413 7.350 - 7.450   pCO2 arterial 30.1 (L) 32.0 - 48.0 mmHg   pO2, Arterial 87.0 83.0 - 108.0 mmHg   Bicarbonate 19.2 (L) 20.0 - 28.0 mmol/L   TCO2 20  (L) 22 - 32 mmol/L   O2 Saturation 97.0 %   Acid-base deficit 4.0 (H) 0.0 - 2.0 mmol/L   Sodium 138 135 - 145 mmol/L   Potassium 3.9 3.5 - 5.1 mmol/L   Calcium, Ion 1.14 (L) 1.15 - 1.40 mmol/L   HCT 34.0 (L) 36.0 - 46.0 %   Hemoglobin 11.6 (L) 12.0 - 15.0 g/dL   Patient temperature 98.6 F    Collection site RADIAL, ALLEN'S TEST ACCEPTABLE    Drawn by Operator    Sample type ARTERIAL   Glucose, capillary     Status: None   Collection Time: 08/03/19  4:08 AM  Result Value Ref Range   Glucose-Capillary 92 70 - 99 mg/dL   Comment 1 Notify RN    Comment 2 Document in Chart   Glucose, capillary     Status: Abnormal   Collection Time: 08/03/19  7:52 AM  Result Value Ref Range   Glucose-Capillary 121 (H) 70 - 99 mg/dL  CBC     Status: Abnormal   Collection Time: 08/03/19  8:00 AM  Result Value Ref Range   WBC 16.3 (H) 4.0 - 10.5 K/uL   RBC 3.71 (L) 3.87 - 5.11 MIL/uL   Hemoglobin 11.3 (L) 12.0 - 15.0 g/dL   HCT 34.6 (L) 36.0 - 46.0 %   MCV 93.3 80.0 - 100.0 fL   MCH 30.5 26.0 - 34.0 pg   MCHC 32.7 30.0 - 36.0 g/dL   RDW 13.3 11.5 - 15.5 %   Platelets 130 (L) 150 - 400 K/uL   nRBC 0.0 0.0 - 0.2 %    Comment: Performed at [facility], [address], [place], [place] [address]  Basic metabolic panel     Status: Abnormal   Collection Time: 08/03/19  8:00 AM  Result Value Ref Range   Sodium 140 135 - 145 mmol/L   Potassium 4.0 3.5 - 5.1 mmol/L   Chloride 107 98 - 111 mmol/L   CO2 19 (L) 22 - 32 mmol/L   Glucose, Bld 134 (H) 70 - 99 mg/dL   BUN 35 (H) 8 - 23 mg/dL   Creatinine, Ser 4.17 (H) 0.44 - 1.00 mg/dL    Comment: RESULT REPEATED AND VERIFIED   Calcium 8.1 (L) 8.9 - 10.3 mg/dL   GFR calc non Af Amer 10 (L) >60 mL/min   GFR calc Af Amer 11 (L) >60 mL/min   Anion gap 14 5 - 15    Comment: Performed at [place] [address] [place], [place] [address]  Magnesium     Status: None   Collection Time: 08/03/19  8:00 AM  Result Value Ref Range   Magnesium  2.0 1.7 - 2.4 mg/dL    Comment: Performed at [facility], [place] [address], [place], [place] [address]  Phosphorus     Status: None   Collection Time: 08/03/19  8:00 AM  Result Value Ref Range   Phosphorus 3.6 2.5 - 4.6 mg/dL    Comment: Performed at [place] [address] [place], [place] [address]  Glucose, capillary     Status: Abnormal   Collection Time: 08/03/19 11:33 AM  Result Value Ref Range   Glucose-Capillary 109 (H) 70 - 99 mg/dL  Lactic acid, plasma     Status: None   Collection Time: 08/03/19  1:09 PM  Result Value Ref Range   Lactic Acid, Venous 1.3 0.5 - 1.9 mmol/L    Comment: Performed at [place] [address] [place], [place] [address]  Glucose, capillary     Status: Abnormal   Collection Time: 08/03/19  3:23 PM  Result Value Ref Range   Glucose-Capillary 123 (H) 70 - 99 mg/dL  Glucose, capillary     Status: None   Collection Time: 08/03/19  7:27 PM  Result Value Ref Range   Glucose-Capillary 90 70 - 99 mg/dL  Glucose, capillary     Status: None   Collection Time: 08/04/19  3:39 AM  Result Value Ref Range   Glucose-Capillary 99 70 - 99 mg/dL  Magnesium     Status: None   Collection Time: 08/04/19  4:00 AM  Result Value Ref Range   Magnesium 2.0 1.7 - 2.4 mg/dL    Comment: Performed at [facility], [place] [address], [place], [place] [address]  Phosphorus     Status: Abnormal   Collection Time: 08/04/19  4:00 AM  Result Value Ref Range   Phosphorus 4.9 (H) 2.5 - 4.6 mg/dL    Comment: Performed at [place] [address] [place], [place] [address]    Recent Results (from the past 240 hour(s))  Respiratory Panel by RT PCR (Flu A&B, Covid) - Nasopharyngeal Swab     Status: Abnormal   Collection Time: 08/02/19 12:18 AM   Specimen: Nasopharyngeal Swab  Result Value Ref Range Status   SARS Coronavirus 2 by RT PCR POSITIVE (A) NEGATIVE Final    Comment: RESULT CALLED TO, READ BACK BY AND VERIFIED WITH: T.  JORDAN,RN 0145 08/02/2019 T. TYSOR (NOTE) SARS-CoV-2 target nucleic acids are DETECTED. SARS-CoV-2 RNA is generally detectable in upper respiratory specimens  during the acute phase of infection. Positive results are indicative of the presence of the identified virus, but do not rule out bacterial infection or co-infection with other pathogens not detected by the test. Clinical correlation with patient history and other diagnostic information is necessary to determine patient infection status. The expected result is Negative. Fact Sheet for Patients:  PinkCheek.be Fact Sheet for Healthcare Providers: GravelBags.it This test is not yet approved or cleared by the [place] FDA and  has been authorized for detection and/or diagnosis of SARS-CoV-2 by FDA under an Emergency Use Authorization (EUA).  This EUA will remain in effect (meaning this test can be used)  for the duration of  the COVID-19 declaration under Section 564(b)(1) of the Act, 21 U.S.C. section 360bbb-3(b)(1), unless the authorization is terminated or revoked sooner.    Influenza A by PCR NEGATIVE NEGATIVE Final  Influenza B by PCR NEGATIVE NEGATIVE Final    Comment: (NOTE) The Xpert Xpress SARS-CoV-2/FLU/RSV assay is intended as an aid in  the diagnosis of influenza from Nasopharyngeal swab specimens and  should not be used as a sole basis for treatment. Nasal washings and  aspirates are unacceptable for Xpert Xpress SARS-CoV-2/FLU/RSV  testing. Fact Sheet for Patients: PinkCheek.be Fact Sheet for Healthcare Providers: GravelBags.it This test is not yet approved or cleared by the [place] FDA and  has been authorized for detection and/or diagnosis of SARS-CoV-2 by  FDA under an Emergency Use Authorization (EUA). This EUA will remain  in effect (meaning this test can be used) for the duration of  the  Covid-19 declaration under Section 564(b)(1) of the Act, 21  U.S.C. section 360bbb-3(b)(1), unless the authorization is  terminated or revoked. Performed at [facility], [place] [address] [place], [place] [address]   Culture, blood (routine x 2)     Status: None (Preliminary result)   Collection Time: 08/02/19  1:37 AM   Specimen: BLOOD  Result Value Ref Range Status   Specimen Description BLOOD GROIN  Final   Special Requests   Final    BOTTLES DRAWN AEROBIC AND ANAEROBIC Blood Culture adequate volume Performed at [facility], [place] [address], [place], [place] [address]    Culture NO GROWTH 2 DAYS  Final   Report Status PENDING  Incomplete  Culture, blood (routine x 2)     Status: None (Preliminary result)   Collection Time: 08/02/19  1:52 AM   Specimen: BLOOD  Result Value Ref Range Status   Specimen Description BLOOD RIGHT HAND  Final   Special Requests   Final    BOTTLES DRAWN AEROBIC ONLY Blood Culture results may not be optimal due to an inadequate volume of blood received in culture bottles Performed at [facility], [place] [address], [place], [place] [address]    Culture NO GROWTH 2 DAYS  Final   Report Status PENDING  Incomplete  Urine culture     Status: Abnormal   Collection Time: 08/02/19  2:50 AM   Specimen: Urine, Catheterized  Result Value Ref Range Status   Specimen Description URINE, CATHETERIZED  Final   Special Requests   Final    NONE Performed at [facility], [place] [address], [place],  [address]    Culture 50,000 COLONIES/mL PROVIDENCIA STUARTII (A)  Final   Report Status 08/04/2019 FINAL  Final   Organism ID, Bacteria PROVIDENCIA STUARTII (A)  Final      Susceptibility   Providencia stuartii - MIC*    AMPICILLIN >=32 RESISTANT Resistant     CEFAZOLIN >=64 RESISTANT Resistant     CEFTRIAXONE <=0.25 SENSITIVE Sensitive     CIPROFLOXACIN <=0.25 SENSITIVE Sensitive     GENTAMICIN RESISTANT Resistant     IMIPENEM 2  SENSITIVE Sensitive     NITROFURANTOIN 256 RESISTANT Resistant     TRIMETH/SULFA <=20 SENSITIVE Sensitive     AMPICILLIN/SULBACTAM 16 INTERMEDIATE Intermediate     PIP/TAZO <=4 SENSITIVE Sensitive     * 50,000 COLONIES/mL PROVIDENCIA STUARTII  Culture, respiratory (non-expectorated)     Status: None (Preliminary result)   Collection Time: 08/02/19  4:38 AM   Specimen: Tracheal Aspirate; Respiratory  Result Value Ref Range Status   Specimen Description TRACHEAL ASPIRATE  Final   Special Requests NONE  Final   Gram Stain   Final    FEW WBC PRESENT, PREDOMINANTLY PMN MODERATE  GRAM POSITIVE COCCI IN PAIRS IN CHAINS IN CLUSTERS FEW GRAM NEGATIVE RODS FEW GRAM POSITIVE RODS    Culture   Final    CULTURE REINCUBATED FOR BETTER GROWTH Performed at [facility], [place] [address], [place], [place] [address]    Report Status PENDING  Incomplete  MRSA PCR Screening     Status: Abnormal   Collection Time: 08/02/19  9:20 AM   Specimen: Nasal Mucosa; Nasopharyngeal  Result Value Ref Range Status   MRSA by PCR (A) NEGATIVE Final    INVALID, UNABLE TO DETERMINE THE PRESENCE OF TARGET DUE TO SPECIMEN INTEGRITY. RECOLLECTION REQUESTED.    Comment:        The GeneXpert MRSA Assay (FDA approved for NASAL specimens only), is one component of a comprehensive MRSA colonization surveillance program. It is not intended to diagnose MRSA infection nor to guide or monitor treatment for MRSA infections. Results Called toMeryl Crutch RN, AT [phone] 08/02/19 BY NF Performed at [facility], [place] [address] [place], [place] [address]     Lipid Panel Recent Labs    08/02/19 0521  CHOL 136  TRIG 66  HDL 43  CHOLHDL 3.2  VLDL 13  LDLCALC 80    Studies/Results: CT HEAD WO CONTRAST  Addendum Date: 08/03/2019   ADDENDUM REPORT: 08/03/2019 01:00 ADDENDUM: These results were called by telephone at the time of interpretation on 08/03/2019 at 1:00 am to provider Dr. Jeannene Patella, Who verbally acknowledged  these results. Electronically Signed   By: Lovena Le M.D.   On: 08/03/2019 01:00   Result Date: 08/03/2019 CLINICAL DATA:  Anoxic brain injury post cardiac arrest EXAM: CT HEAD WITHOUT CONTRAST TECHNIQUE: Contiguous axial images were obtained from the base of the skull through the vertex without intravenous contrast. COMPARISON:  None. FINDINGS: Brain: Evaluation of the parenchyma is limited due to extensive streak artifact from EEG placement. There is some discernible preservation of the gray-white differentiation and the brain lacks distinct features of cerebral edema. However there is a geographic hypoattenuating region in the mesial left temporal lobe and insula which is worrisome for acute/subacute infarct in the giving clinical setting. No extra-axial hemorrhage or fluid collection is clearly evident. Vascular: Atherosclerotic calcification of the carotid siphons. No hyperdense vessel. Skull: Hyperostosis frontalis interna is present. Sinuses/Orbits: Thickening throughout the sinuses with secretions in the posterior nasopharynx which may be related to intubation seen on scout films. Mastoid air cells are well aerated. Orbits are unremarkable aside from a disc conjugate gaze which can be seen with unconsciousness/sedation. Other: None IMPRESSION: Limited study due to extensive streak artifact from EEG placement. Region of hypoattenuation in the mesial left temporal lobe and insula is concerning for acute infarct in the setting of cardiovascular resuscitation. No convincing features of global cerebral anoxic injury or cerebral edema. Intracranial atherosclerosis. Intubation with secretions in the posterior nasopharynx and sinus thickening. Electronically Signed: By: Lovena Le M.D. On: 08/03/2019 00:44   MR BRAIN WO CONTRAST  Result Date: 08/04/2019 CLINICAL DATA:  Anoxic brain injury EXAM: MRI HEAD WITHOUT CONTRAST TECHNIQUE: Multiplanar, multiecho pulse sequences of the brain and surrounding  structures were obtained without intravenous contrast. COMPARISON:  None. FINDINGS: Brain: Diffuse cortical signal abnormality throughout both hemispheres and the cerebellum. No midline shift, hydrocephalus or hemorrhage. Vascular: Normal flow voids. Skull and upper cervical spine: Normal marrow signal. Sinuses/Orbits: Negative. Other: None. IMPRESSION: Diffuse cortical signal abnormality throughout both hemispheres and the cerebellum, consistent with severe hypoxic ischemic injury. Electronically Signed  By: Ulyses Jarred M.D.   On: 08/04/2019 00:01   DG Chest Port 1 View  Result Date: 08/03/2019 CLINICAL DATA:  COVID pneumonia EXAM: PORTABLE CHEST 1 VIEW COMPARISON:  08/02/2019 FINDINGS: The endotracheal tube is 3.9 cm above the carina. The NG tube is coursing down the esophagus and into the stomach. Persistent bilateral infiltrates. No definite pleural effusions or pneumothorax. IMPRESSION: Stable support apparatus. Persistent bilateral infiltrates. Electronically Signed   By: Marijo Sanes M.D.   On: 08/03/2019 05:08   Overnight EEG with video  Result Date: 08/03/2019 Lora Havens, MD     08/03/2019 12:16 PM Patient Name: Melody Flores MRN: [id number] Epilepsy Attending: Lora Havens Referring Physician/Provider: Dr Amie [place] Duration: 08/02/2019 8841 08/03/2019 1046 Patient history: 74 year old woman with dilated cardiomyopathy, moderate dilated LA, nonobstructive CAD, OSA, COPD, hypertension, cervical cancer brought in with witnessed cardiac arrest x3 requiring CPR and total downtime of about 30 minutes prior to ROSC. She tested to be Covid positive and has been placed in the Covid ICU after intubation. She started exhibiting whole body myoclonic jerking after propofol was held for exam. EEG to evaluate for seizures. Level of alertness: comatose AEDs during EEG study: propofol, keppra Technical aspects: This EEG study was done with scalp electrodes positioned according to the 10-20  International system of electrode placement. Electrical activity was acquired at a sampling rate of '500Hz'$  and reviewed with a high frequency filter of '70Hz'$  and a low frequency filter of '1Hz'$ . EEG data were recorded continuously and digitally stored. DESCRIPTION: EEG showed continuous generalized 3-'6hz'$  theta-delta slowing as well as intermittent rhythmic 2-'3hz'$  delta slowing. Hyperventilation and photic stimulation were not performed. ABNORMALITY - Continuous slow, generalized - Intermittent rhythmic slow, generalized IMPRESSION: This study is suggestive of severe diffuse encephalopathy, non specific to etiology. No seizures or epileptiform discharges were seen throughout the recording. Lora Havens   ECHOCARDIOGRAM COMPLETE  Result Date: 08/02/2019   ECHOCARDIOGRAM REPORT   Patient Name:   Melody Flores Date of Exam: 08/02/2019 Medical Rec #:  [id number]          Height:       68.0 in Accession #:    [id number]         Weight:       330.0 lb Date of Birth:  Oct 29, 1945          BSA:          2.53 m Patient Age:    74 years           BP:           113/60 mmHg Patient Gender: F                  HR:           85 bpm. Exam Location:  Inpatient Procedure: 2D Echo, Color Doppler and Cardiac Doppler Indications:    Cardiac Arrest i46.9  History:        Patient has prior history of Echocardiogram examinations, most                 recent 07/19/2014. CHF, COPD, Arrythmias:LBBB; Risk Factors:Sleep                 Apnea and Hypertension. COVID+ 08/02/19.  Sonographer:    Raquel Sarna Senior RDCS Referring Phys: [id number] [place]  1. Left ventricular ejection fraction, by visual estimation, is 55 to 60%. The left ventricle has normal function. There is moderately  increased left ventricular hypertrophy.  2. Left ventricular diastolic parameters are consistent with Grade I diastolic dysfunction (impaired relaxation).  3. The left ventricle has no regional wall motion abnormalities.  4. Global right ventricle has  normal systolic function.The right ventricular size is normal. No increase in right ventricular wall thickness.  5. Left atrial size was normal.  6. Right atrial size was normal.  7. Small pericardial effusion.  8. The pericardial effusion is circumferential.  9. The mitral valve is normal in structure. No evidence of mitral valve regurgitation. No evidence of mitral stenosis. 10. The tricuspid valve is normal in structure. 11. The tricuspid valve is normal in structure. Tricuspid valve regurgitation is not demonstrated. 12. The aortic valve was not well visualized. Aortic valve regurgitation is not visualized. No evidence of aortic valve sclerosis or stenosis. 13. The pulmonic valve was not well visualized. Pulmonic valve regurgitation is not visualized. 14. The inferior vena cava is dilated in size with <50% respiratory variability, suggesting right atrial pressure of 15 mmHg. 15. The interatrial septum was not well visualized. FINDINGS  Left Ventricle: Left ventricular ejection fraction, by visual estimation, is 55 to 60%. The left ventricle has normal function. The left ventricle has no regional wall motion abnormalities. There is moderately increased left ventricular hypertrophy. Left ventricular diastolic parameters are consistent with Grade I diastolic dysfunction (impaired relaxation). Normal left atrial pressure. Right Ventricle: The right ventricular size is normal. No increase in right ventricular wall thickness. Global RV systolic function is has normal systolic function. Left Atrium: Left atrial size was normal in size. Right Atrium: Right atrial size was normal in size Pericardium: A small pericardial effusion is present. The pericardial effusion is circumferential. Mitral Valve: The mitral valve is normal in structure. No evidence of mitral valve regurgitation. No evidence of mitral valve stenosis by observation. Tricuspid Valve: The tricuspid valve is normal in structure. Tricuspid valve  regurgitation is not demonstrated. Aortic Valve: The aortic valve was not well visualized. Aortic valve regurgitation is not visualized. The aortic valve is structurally normal, with no evidence of sclerosis or stenosis. Aortic valve mean gradient measures 6.6 mmHg. Aortic valve peak gradient measures 11.9 mmHg. Aortic valve area, by VTI measures 1.77 cm. Pulmonic Valve: The pulmonic valve was not well visualized. Pulmonic valve regurgitation is not visualized. Pulmonic regurgitation is not visualized. No evidence of pulmonic stenosis. Aorta: The aortic root is normal in size and structure. Pulmonary Artery: Indeterminant PASP, inadequate TR jet. Venous: The inferior vena cava is dilated in size with less than 50% respiratory variability, suggesting right atrial pressure of 15 mmHg. IAS/Shunts: The interatrial septum was not well visualized.  LEFT VENTRICLE PLAX 2D LVIDd:         3.80 cm  Diastology LVIDs:         2.80 cm  LV e' lateral:   4.35 cm/s LV PW:         1.30 cm  LV E/e' lateral: 13.8 LV IVS:        1.20 cm  LV e' medial:    4.68 cm/s LVOT diam:     1.90 cm  LV E/e' medial:  12.9 LV SV:         32 ml LV SV Index:   11.72 LVOT Area:     2.84 cm  RIGHT VENTRICLE RV S prime:     13.40 cm/s TAPSE (M-mode): 2.1 cm LEFT ATRIUM             Index  RIGHT ATRIUM           Index LA diam:        4.00 cm 1.58 cm/m  RA Area:     11.00 cm LA Vol (A2C):   54.4 ml 21.52 ml/m RA Volume:   20.90 ml  8.27 ml/m LA Vol (A4C):   49.1 ml 19.42 ml/m LA Biplane Vol: 55.0 ml 21.75 ml/m  AORTIC VALVE AV Area (Vmax):    1.64 cm AV Area (Vmean):   1.36 cm AV Area (VTI):     1.77 cm AV Vmax:           172.59 cm/s AV Vmean:          122.080 cm/s AV VTI:            0.245 m AV Peak Grad:      11.9 mmHg AV Mean Grad:      6.6 mmHg LVOT Vmax:         100.00 cm/s LVOT Vmean:        58.600 cm/s LVOT VTI:          0.153 m LVOT/AV VTI ratio: 0.62  AORTA Ao Root diam: 2.80 cm Ao Asc diam:  3.10 cm MITRAL VALVE MV Area (PHT): 3.83  cm              SHUNTS MV PHT:        57.42 msec            Systemic VTI:  0.15 m MV Decel Time: 198 msec              Systemic Diam: 1.90 cm MV E velocity: 60.20 cm/s  103 cm/s MV A velocity: 104.00 cm/s 70.3 cm/s MV E/A ratio:  0.58        1.5  Carlyle Dolly MD Electronically signed by Carlyle Dolly MD Signature Date/Time: 08/02/2019/4:37:24 PM    Final     Medications:  Scheduled: . chlorhexidine  15 mL Mouth Rinse BID  . chlorhexidine gluconate (MEDLINE KIT)  15 mL Mouth Rinse BID  . Chlorhexidine Gluconate Cloth  6 each Topical Daily  . dexamethasone (DECADRON) injection  6 mg Intravenous Q24H  . feeding supplement (PRO-STAT SUGAR FREE 64)  30 mL Per Tube Daily  . feeding supplement (VITAL HIGH PROTEIN)  1,000 mL Per Tube Q24H  . free water  95 mL Per Tube Q6H  . heparin  5,000 Units Subcutaneous Q8H  . ipratropium-albuterol  3 mL Nebulization Q6H  . mouth rinse  15 mL Mouth Rinse 10 times per day  . pantoprazole (PROTONIX) IV  40 mg Intravenous QHS   Continuous: . epinephrine Stopped (08/02/19 0422)  . levETIRAcetam 500 mg (08/04/19 0236)  . levofloxacin (LEVAQUIN) IV 750 mg (08/03/19 2200)  . remdesivir 100 mg in NS 100 mL 100 mg (08/03/19 0848)    MRI brain: Diffuse cortical signal abnormality throughout both hemispheres and the cerebellum, consistent with severe hypoxic ischemic injury.  Assessment: 74 year old Covid-positive female with multiple medical comorbidities, brought in with witnessed cardiac arrests x 3 requiring CPR and total downtime of about 30 minutes prior to ROSC. In the ICU, upon reducing propofol sedation, she exhibited whole body myoclonic jerking. MRI reveals diffuse, extensive cytotoxic edema, confirming severe diffuse anoxic brain injury.  1. Overall presentation most consistent with diffuse anoxic brain injury. Clinically, presence of myoclonus early in the course of post-cardiac arrest usually portends a poor prognosis for a neurologically  meaningful recovery. 2. LTM EEG showed  no seizures or epileptiform discharges. Continuous slowing was suggestive of encephalopathy.   Recommendations: 1. Continue Keppra 500 mg twice daily 2. If myoclonic jerking recurs, consider adding Klonopin. We are hesitant to add Depakote due to her deranged liver function. 3. Would discuss goals of care with family. The patient's long term neurological prognosis would most likely be either a comatose or persistent vegetative state.  4. Neurology will sign off. Please call if there are additional questions.    35 minutes spent in the neurological evaluation and management of this critically ill patient.    LOS: 2 days   '@Electronically'$  signed: Dr. Kerney Elbe 08/04/2019  7:30 AM

## 2019-08-04 NOTE — Progress Notes (Signed)
Patient transported to MRI and back without complications.

## 2019-08-04 NOTE — Progress Notes (Signed)
Pt. had two family members (son and daughter) visit patient at bedside for 15 min. Proper PPE was given to each family member. Visitation was from 1105-1120.

## 2019-08-04 NOTE — Progress Notes (Signed)
Patient changed to CPAP/PS earlier with high RR and normal VT noted. More sedation given to patient.   RT reassessed on CPAP/PS and noted high RR and low VT/low ME.

## 2019-08-04 NOTE — Progress Notes (Signed)
RT note- Family with patient at this time, no changes, continue to monitor.

## 2019-08-07 DIAGNOSIS — N179 Acute kidney failure, unspecified: Secondary | ICD-10-CM

## 2019-08-07 DIAGNOSIS — I469 Cardiac arrest, cause unspecified: Secondary | ICD-10-CM

## 2019-08-07 LAB — CULTURE, BLOOD (ROUTINE X 2)
Culture: NO GROWTH
Culture: NO GROWTH
Special Requests: ADEQUATE

## 2019-08-31 NOTE — Progress Notes (Signed)
Pt assessed. Pt looks comfortable. Will continue to assess.

## 2019-08-31 NOTE — Procedures (Signed)
Extubation Procedure Note  Patient Details:   Name: Melody Flores DOB: [birth year] MRN: [id number]   Airway Documentation:    Vent end date: 08/04/19 Vent end time: 2354   Evaluation  O2 sats: stable throughout Complications: No apparent complications Patient did tolerate procedure well. Bilateral Breath Sounds: Coarse crackles, Diminished   No   Patient compassionately extubated to room air for comfort care.   [place] 2019/08/07, 12:14 AM

## 2019-08-31 NOTE — Death Summary Note (Signed)
DEATH SUMMARY   Patient Details  Name: Melody Flores MRN: [id number] DOB: August 19, 1945  Admission/Discharge Information   Admit Date:  Aug 04, 2019  Date of Death: Date of Death: 08/08/19  Time of Death: Time of Death: 0558  Length of Stay: 3  Referring Physician: Nolene Ebbs, MD   Reason(s) for Hospitalization  74 y.o. F with PMH of COPD and dilated cardiomyopathy who complained of shortness of breath yesterday.  Family called EMS and pt had a witnessed PEA/Vtach arrest with ROSC three times.  Intubated in the ED with infiltrates on CXR.  Covid-19 positive  Diagnoses  Preliminary cause of death: Acute hypoxemic respiratory failure due to COVID-19 [facility]) Secondary Diagnoses (including complications and co-morbidities):  Active Problems:   Congestive dilated cardiomyopathy (HCC)   Cardiac arrest ([place])   Anoxic brain injury ([place])   Acute respiratory failure with hypoxemia (HCC)   Cardiac arrest, cause unspecified ([place])   Acute renal failure (ARF) [facility])   Brief Hospital Course (including significant findings, care, treatment, and services provided and events leading to death)  Melody Flores is a 74 y.o. year old female who PMH of dilated cardiomyopathy with EF of 20-25%, obesity and OSA, COPD, HTN and obesity who complained of shortness of breath today.  Family called EMS and patient had witnessed PEA arrest with ROSC x2, at some point was in [place] and given amiodarone and defibrillated.  She then loss of pulses again with CPR in progress on arrival.  Per report, pt received total of 8mg  Epinephrine and then placed on Epi gtt.   Work-up in the ED significant for bilateral airspace opacities on CXR, WBC 22k, lactic acid 8.9, Creatinine 1.8, LBBB on EKG of unclear chronicity with troponin of 29.   Blood pressure was stable and patient was given levaquin.  Covid-19 test resulted positive.  Spoke with patient's granddaughter [name] who lives with her, she notes several family  members with URI sx's recently, but had tested negative for Covid-19.  Patient remained on mechanical life support.  Patient remained off of sedation with no significant neurologic recovery.  Decision was made to obtain EEG and MRI.  Both consistent with anoxic brain injury.  Discussion with family regarding patient's wishes and goals of care in this situation after suffering significant brain injury.  Decision was made to withdraw care.  The critical care withdrawal of life-sustaining treatment order set was placed.  Patient passed peacefully in the intensive care unit patient's granddaughter and brother were able to visit prior to this.  They are appreciative of the care received.   Pertinent Labs and Studies  Significant Diagnostic Studies CT HEAD WO CONTRAST  Addendum Date: 08/03/2019   ADDENDUM REPORT: 08/03/2019 01:00 ADDENDUM: These results were called by telephone at the time of interpretation on 08/03/2019 at 1:00 am to provider Dr. Jeannene Patella, Who verbally acknowledged these results. Electronically Signed   By: Lovena Le M.D.   On: 08/03/2019 01:00   Result Date: 08/03/2019 CLINICAL DATA:  Anoxic brain injury post cardiac arrest EXAM: CT HEAD WITHOUT CONTRAST TECHNIQUE: Contiguous axial images were obtained from the base of the skull through the vertex without intravenous contrast. COMPARISON:  None. FINDINGS: Brain: Evaluation of the parenchyma is limited due to extensive streak artifact from EEG placement. There is some discernible preservation of the gray-white differentiation and the brain lacks distinct features of cerebral edema. However there is a geographic hypoattenuating region in the mesial left temporal lobe and insula which is worrisome for acute/subacute infarct in  the giving clinical setting. No extra-axial hemorrhage or fluid collection is clearly evident. Vascular: Atherosclerotic calcification of the carotid siphons. No hyperdense vessel. Skull: Hyperostosis frontalis interna  is present. Sinuses/Orbits: Thickening throughout the sinuses with secretions in the posterior nasopharynx which may be related to intubation seen on scout films. Mastoid air cells are well aerated. Orbits are unremarkable aside from a disc conjugate gaze which can be seen with unconsciousness/sedation. Other: None IMPRESSION: Limited study due to extensive streak artifact from EEG placement. Region of hypoattenuation in the mesial left temporal lobe and insula is concerning for acute infarct in the setting of cardiovascular resuscitation. No convincing features of global cerebral anoxic injury or cerebral edema. Intracranial atherosclerosis. Intubation with secretions in the posterior nasopharynx and sinus thickening. Electronically Signed: By: Lovena Le M.D. On: 08/03/2019 00:44   MR BRAIN WO CONTRAST  Result Date: 08/04/2019 CLINICAL DATA:  Anoxic brain injury EXAM: MRI HEAD WITHOUT CONTRAST TECHNIQUE: Multiplanar, multiecho pulse sequences of the brain and surrounding structures were obtained without intravenous contrast. COMPARISON:  None. FINDINGS: Brain: Diffuse cortical signal abnormality throughout both hemispheres and the cerebellum. No midline shift, hydrocephalus or hemorrhage. Vascular: Normal flow voids. Skull and upper cervical spine: Normal marrow signal. Sinuses/Orbits: Negative. Other: None. IMPRESSION: Diffuse cortical signal abnormality throughout both hemispheres and the cerebellum, consistent with severe hypoxic ischemic injury. Electronically Signed   By: Ulyses Jarred M.D.   On: 08/04/2019 00:01   DG Chest Port 1 View  Result Date: 08/03/2019 CLINICAL DATA:  COVID pneumonia EXAM: PORTABLE CHEST 1 VIEW COMPARISON:  08/02/2019 FINDINGS: The endotracheal tube is 3.9 cm above the carina. The NG tube is coursing down the esophagus and into the stomach. Persistent bilateral infiltrates. No definite pleural effusions or pneumothorax. IMPRESSION: Stable support apparatus. Persistent  bilateral infiltrates. Electronically Signed   By: Marijo Sanes M.D.   On: 08/03/2019 05:08   DG Chest Portable 1 View  Result Date: 08/02/2019 CLINICAL DATA:  Tube placement EXAM: PORTABLE CHEST 1 VIEW COMPARISON:  Radiograph March 02, 2019 FINDINGS: *Endotracheal tube in the mid trachea, 4.2 cm from the carina. *Transesophageal tube tip and side port below the GE junction, beyond the level of imaging. *Multiple telemetry leads and monitoring devices overlie the chest. *Defibrillator pad over the left chest wall. Patchy opacities are present throughout both lungs more focally in the right upper lobe and infrahilar lung. No visible pneumothorax or effusion. Cardiac silhouette is borderline enlarged though may be accentuated by portable supine technique. The aorta is calcified. The remaining cardiomediastinal contours are unremarkable. No acute osseous or soft tissue abnormality. IMPRESSION: Satisfactory positioning of lines and tubes. Bilateral airspace opacities, right greater than left, could reflect edema, pneumonia or ARDS. Electronically Signed   By: Lovena Le M.D.   On: 08/02/2019 00:36   Overnight EEG with video  Result Date: 08/03/2019 Lora Havens, MD     08/03/2019 12:16 PM Patient Name: Melody Flores MRN: [id number] Epilepsy Attending: Lora Havens Referring Physician/Provider: Dr Amie [place] Duration: 08/02/2019 [id number] 08/03/2019 1046 Patient history: 74 year old woman with dilated cardiomyopathy, moderate dilated LA, nonobstructive CAD, OSA, COPD, hypertension, cervical cancer brought in with witnessed cardiac arrest x3 requiring CPR and total downtime of about 30 minutes prior to ROSC. She tested to be Covid positive and has been placed in the Covid ICU after intubation. She started exhibiting whole body myoclonic jerking after propofol was held for exam. EEG to evaluate for seizures. Level of alertness: comatose AEDs during EEG study: propofol,  keppra Technical aspects: This EEG  study was done with scalp electrodes positioned according to the 10-20 International system of electrode placement. Electrical activity was acquired at a sampling rate of 500Hz  and reviewed with a high frequency filter of 70Hz  and a low frequency filter of 1Hz . EEG data were recorded continuously and digitally stored. DESCRIPTION: EEG showed continuous generalized 3-6hz  theta-delta slowing as well as intermittent rhythmic 2-3hz  delta slowing. Hyperventilation and photic stimulation were not performed. ABNORMALITY - Continuous slow, generalized - Intermittent rhythmic slow, generalized IMPRESSION: This study is suggestive of severe diffuse encephalopathy, non specific to etiology. No seizures or epileptiform discharges were seen throughout the recording. Lora Havens   ECHOCARDIOGRAM COMPLETE  Result Date: 08/02/2019   ECHOCARDIOGRAM REPORT   Patient Name:   Melody Flores Date of Exam: 08/02/2019 Medical Rec #:  [id number]          Height:       68.0 in Accession #:    [id number]         Weight:       330.0 lb Date of Birth:  1946-06-06          BSA:          2.53 m Patient Age:    30 years           BP:           113/60 mmHg Patient Gender: F                  HR:           85 bpm. Exam Location:  Inpatient Procedure: 2D Echo, Color Doppler and Cardiac Doppler Indications:    Cardiac Arrest i46.9  History:        Patient has prior history of Echocardiogram examinations, most                 recent 07/19/2014. CHF, COPD, Arrythmias:LBBB; Risk Factors:Sleep                 Apnea and Hypertension. COVID+ 08/02/19.  Sonographer:    Raquel Sarna Senior RDCS Referring Phys: [id number] [place]  1. Left ventricular ejection fraction, by visual estimation, is 55 to 60%. The left ventricle has normal function. There is moderately increased left ventricular hypertrophy.  2. Left ventricular diastolic parameters are consistent with Grade I diastolic dysfunction (impaired relaxation).  3. The left ventricle has  no regional wall motion abnormalities.  4. Global right ventricle has normal systolic function.The right ventricular size is normal. No increase in right ventricular wall thickness.  5. Left atrial size was normal.  6. Right atrial size was normal.  7. Small pericardial effusion.  8. The pericardial effusion is circumferential.  9. The mitral valve is normal in structure. No evidence of mitral valve regurgitation. No evidence of mitral stenosis. 10. The tricuspid valve is normal in structure. 11. The tricuspid valve is normal in structure. Tricuspid valve regurgitation is not demonstrated. 12. The aortic valve was not well visualized. Aortic valve regurgitation is not visualized. No evidence of aortic valve sclerosis or stenosis. 13. The pulmonic valve was not well visualized. Pulmonic valve regurgitation is not visualized. 14. The inferior vena cava is dilated in size with <50% respiratory variability, suggesting right atrial pressure of 15 mmHg. 15. The interatrial septum was not well visualized. FINDINGS  Left Ventricle: Left ventricular ejection fraction, by visual estimation, is 55 to 60%. The left ventricle has normal function. The left ventricle  has no regional wall motion abnormalities. There is moderately increased left ventricular hypertrophy. Left ventricular diastolic parameters are consistent with Grade I diastolic dysfunction (impaired relaxation). Normal left atrial pressure. Right Ventricle: The right ventricular size is normal. No increase in right ventricular wall thickness. Global RV systolic function is has normal systolic function. Left Atrium: Left atrial size was normal in size. Right Atrium: Right atrial size was normal in size Pericardium: A small pericardial effusion is present. The pericardial effusion is circumferential. Mitral Valve: The mitral valve is normal in structure. No evidence of mitral valve regurgitation. No evidence of mitral valve stenosis by observation. Tricuspid Valve:  The tricuspid valve is normal in structure. Tricuspid valve regurgitation is not demonstrated. Aortic Valve: The aortic valve was not well visualized. Aortic valve regurgitation is not visualized. The aortic valve is structurally normal, with no evidence of sclerosis or stenosis. Aortic valve mean gradient measures 6.6 mmHg. Aortic valve peak gradient measures 11.9 mmHg. Aortic valve area, by VTI measures 1.77 cm. Pulmonic Valve: The pulmonic valve was not well visualized. Pulmonic valve regurgitation is not visualized. Pulmonic regurgitation is not visualized. No evidence of pulmonic stenosis. Aorta: The aortic root is normal in size and structure. Pulmonary Artery: Indeterminant PASP, inadequate TR jet. Venous: The inferior vena cava is dilated in size with less than 50% respiratory variability, suggesting right atrial pressure of 15 mmHg. IAS/Shunts: The interatrial septum was not well visualized.  LEFT VENTRICLE PLAX 2D LVIDd:         3.80 cm  Diastology LVIDs:         2.80 cm  LV e' lateral:   4.35 cm/s LV PW:         1.30 cm  LV E/e' lateral: 13.8 LV IVS:        1.20 cm  LV e' medial:    4.68 cm/s LVOT diam:     1.90 cm  LV E/e' medial:  12.9 LV SV:         32 ml LV SV Index:   11.72 LVOT Area:     2.84 cm  RIGHT VENTRICLE RV S prime:     13.40 cm/s TAPSE (M-mode): 2.1 cm LEFT ATRIUM             Index       RIGHT ATRIUM           Index LA diam:        4.00 cm 1.58 cm/m  RA Area:     11.00 cm LA Vol (A2C):   54.4 ml 21.52 ml/m RA Volume:   20.90 ml  8.27 ml/m LA Vol (A4C):   49.1 ml 19.42 ml/m LA Biplane Vol: 55.0 ml 21.75 ml/m  AORTIC VALVE AV Area (Vmax):    1.64 cm AV Area (Vmean):   1.36 cm AV Area (VTI):     1.77 cm AV Vmax:           172.59 cm/s AV Vmean:          122.080 cm/s AV VTI:            0.245 m AV Peak Grad:      11.9 mmHg AV Mean Grad:      6.6 mmHg LVOT Vmax:         100.00 cm/s LVOT Vmean:        58.600 cm/s LVOT VTI:          0.153 m LVOT/AV VTI ratio: 0.62  AORTA Ao Root diam:  2.80 cm Ao Asc diam:  3.10 cm MITRAL VALVE MV Area (PHT): 3.83 cm              SHUNTS MV PHT:        57.42 msec            Systemic VTI:  0.15 m MV Decel Time: 198 msec              Systemic Diam: 1.90 cm MV E velocity: 60.20 cm/s  103 cm/s MV A velocity: 104.00 cm/s 70.3 cm/s MV E/A ratio:  0.58        1.5  Carlyle Dolly MD Electronically signed by Carlyle Dolly MD Signature Date/Time: 08/02/2019/4:37:24 PM    Final     Microbiology Recent Results (from the past 240 hour(s))  Respiratory Panel by RT PCR (Flu A&B, Covid) - Nasopharyngeal Swab     Status: Abnormal   Collection Time: 08/02/19 12:18 AM   Specimen: Nasopharyngeal Swab  Result Value Ref Range Status   SARS Coronavirus 2 by RT PCR POSITIVE (A) NEGATIVE Final    Comment: RESULT CALLED TO, READ BACK BY AND VERIFIED WITH: T. JORDAN,RN 0145 08/02/2019 T. TYSOR (NOTE) SARS-CoV-2 target nucleic acids are DETECTED. SARS-CoV-2 RNA is generally detectable in upper respiratory specimens  during the acute phase of infection. Positive results are indicative of the presence of the identified virus, but do not rule out bacterial infection or co-infection with other pathogens not detected by the test. Clinical correlation with patient history and other diagnostic information is necessary to determine patient infection status. The expected result is Negative. Fact Sheet for Patients:  PinkCheek.be Fact Sheet for Healthcare Providers: GravelBags.it This test is not yet approved or cleared by the [place] FDA and  has been authorized for detection and/or diagnosis of SARS-CoV-2 by FDA under an Emergency Use Authorization (EUA).  This EUA will remain in effect (meaning this test can be used)  for the duration of  the COVID-19 declaration under Section 564(b)(1) of the Act, 21 U.S.C. section 360bbb-3(b)(1), unless the authorization is terminated or revoked sooner.    Influenza  A by PCR NEGATIVE NEGATIVE Final   Influenza B by PCR NEGATIVE NEGATIVE Final    Comment: (NOTE) The Xpert Xpress SARS-CoV-2/FLU/RSV assay is intended as an aid in  the diagnosis of influenza from Nasopharyngeal swab specimens and  should not be used as a sole basis for treatment. Nasal washings and  aspirates are unacceptable for Xpert Xpress SARS-CoV-2/FLU/RSV  testing. Fact Sheet for Patients: PinkCheek.be Fact Sheet for Healthcare Providers: GravelBags.it This test is not yet approved or cleared by the [place] FDA and  has been authorized for detection and/or diagnosis of SARS-CoV-2 by  FDA under an Emergency Use Authorization (EUA). This EUA will remain  in effect (meaning this test can be used) for the duration of the  Covid-19 declaration under Section 564(b)(1) of the Act, 21  U.S.C. section 360bbb-3(b)(1), unless the authorization is  terminated or revoked. Performed at [facility], [place] [address], [place],  [address]   Culture, blood (routine x 2)     Status: None   Collection Time: 08/02/19  1:37 AM   Specimen: BLOOD  Result Value Ref Range Status   Specimen Description BLOOD GROIN  Final   Special Requests   Final    BOTTLES DRAWN AEROBIC AND ANAEROBIC Blood Culture adequate volume   Culture   Final    NO GROWTH 5 DAYS Performed at [facility]  [facility], [place] [address] [place], [place] [address]    Report Status 08/07/2019 FINAL  Final  Culture, blood (routine x 2)     Status: None   Collection Time: 08/02/19  1:52 AM   Specimen: BLOOD  Result Value Ref Range Status   Specimen Description BLOOD RIGHT HAND  Final   Special Requests   Final    BOTTLES DRAWN AEROBIC ONLY Blood Culture results may not be optimal due to an inadequate volume of blood received in culture bottles   Culture   Final    NO GROWTH 5 DAYS Performed at [facility], [place] [address] [place], [place] [address]     Report Status 08/07/2019 FINAL  Final  Urine culture     Status: Abnormal   Collection Time: 08/02/19  2:50 AM   Specimen: Urine, Catheterized  Result Value Ref Range Status   Specimen Description URINE, CATHETERIZED  Final   Special Requests   Final    NONE Performed at [facility], [place] [address] [place], [place] [address]    Culture 50,000 COLONIES/mL PROVIDENCIA STUARTII (A)  Final   Report Status 08/04/2019 FINAL  Final   Organism ID, Bacteria PROVIDENCIA STUARTII (A)  Final      Susceptibility   Providencia stuartii - MIC*    AMPICILLIN >=32 RESISTANT Resistant     CEFAZOLIN >=64 RESISTANT Resistant     CEFTRIAXONE <=0.25 SENSITIVE Sensitive     CIPROFLOXACIN <=0.25 SENSITIVE Sensitive     GENTAMICIN RESISTANT Resistant     IMIPENEM 2 SENSITIVE Sensitive     NITROFURANTOIN 256 RESISTANT Resistant     TRIMETH/SULFA <=20 SENSITIVE Sensitive     AMPICILLIN/SULBACTAM 16 INTERMEDIATE Intermediate     PIP/TAZO <=4 SENSITIVE Sensitive     * 50,000 COLONIES/mL PROVIDENCIA STUARTII  Culture, respiratory (non-expectorated)     Status: None   Collection Time: 08/02/19  4:38 AM   Specimen: Tracheal Aspirate; Respiratory  Result Value Ref Range Status   Specimen Description TRACHEAL ASPIRATE  Final   Special Requests NONE  Final   Gram Stain   Final    FEW WBC PRESENT, PREDOMINANTLY PMN MODERATE GRAM POSITIVE COCCI IN PAIRS IN CHAINS IN CLUSTERS FEW GRAM NEGATIVE RODS FEW GRAM POSITIVE RODS    Culture   Final    Consistent with normal respiratory flora. Performed at [facility], [place] [address] [place], [place] [address]    Report Status 08/04/2019 FINAL  Final  MRSA PCR Screening     Status: Abnormal   Collection Time: 08/02/19  9:20 AM   Specimen: Nasal Mucosa; Nasopharyngeal  Result Value Ref Range Status   MRSA by PCR (A) NEGATIVE Final    INVALID, UNABLE TO DETERMINE THE PRESENCE OF TARGET DUE TO SPECIMEN INTEGRITY. RECOLLECTION REQUESTED.    Comment:         The GeneXpert MRSA Assay (FDA approved for NASAL specimens only), is one component of a comprehensive MRSA colonization surveillance program. It is not intended to diagnose MRSA infection nor to guide or monitor treatment for MRSA infections. Results Called toMeryl Crutch RN, AT [phone] 08/02/19 BY NF Performed at [facility], [place] [address], [place], [place] [address]     Lab Basic Metabolic Panel: Recent Labs  Lab 08/02/19 0005 08/02/19 0005 08/02/19 0044 08/02/19 0521 08/02/19 0524 08/03/19 0402 08/03/19 0800 08/04/19 0400  NA 139   < > 140 140 140 138 140  --  K 4.8   < > 4.6 4.0 3.9 3.9 4.0  --   CL 106  --   --  106  --   --  107  --   CO2 17*  --   --  22  --   --  19*  --   GLUCOSE 167*  --   --  163*  --   --  134*  --   BUN 14  --   --  18  --   --  35*  --   CREATININE 1.86*  --   --  2.05*  --   --  4.17*  --   CALCIUM 8.0*  --   --  8.3*  --   --  8.1*  --   MG  --   --   --  1.9  --   --  2.0 2.0  PHOS  --   --   --   --   --   --  3.6 4.9*   < > = values in this interval not displayed.   Liver Function Tests: Recent Labs  Lab 08/02/19 0005 08/02/19 0521  AST 105* 159*  ALT 54* 72*  ALKPHOS 104 120  BILITOT 0.5 1.0  PROT 6.5 7.2  ALBUMIN 2.4* 2.7*   No results for input(s): LIPASE, AMYLASE in the last 168 hours. No results for input(s): AMMONIA in the last 168 hours. CBC: Recent Labs  Lab 08/02/19 0005 08/02/19 0005 08/02/19 0044 08/02/19 0521 08/02/19 0524 08/03/19 0402 08/03/19 0800  WBC 22.1*  --   --  11.3*  --   --  16.3*  NEUTROABS 15.1*  --   --   --   --   --   --   HGB 9.9*   < > 10.9* 11.2* 12.2 11.6* 11.3*  HCT 33.3*   < > 32.0* 36.0 36.0 34.0* 34.6*  MCV 104.7*  --   --  98.1  --   --  93.3  PLT 97*  --   --  137*  --   --  130*   < > = values in this interval not displayed.   Cardiac Enzymes: No results for input(s): CKTOTAL, CKMB, CKMBINDEX, TROPONINI in the last 168 hours. Sepsis Labs: Recent Labs  Lab  08/02/19 0005 08/02/19 0521 08/03/19 0800 08/03/19 1309  WBC 22.1* 11.3* 16.3*  --   LATICACIDVEN 8.9*  --   --  1.3    Procedures/Operations  MRI    Leory Plowman L Betty Daidone 08/07/2019, 4:05 PM

## 2019-08-31 NOTE — Progress Notes (Signed)
Wasted 1mls of Morphine and 5mls of Versed with [place], Therapist, sports.

## 2019-08-31 DEATH — deceased

## 2021-03-26 IMAGING — DX DG CHEST 1V PORT
1 series · 2 of 2 positions shown · non-contrast
Comparison: Radiograph March 02, 2019

CLINICAL DATA: Tube placement

EXAM:
PORTABLE CHEST 1 VIEW

[Series 1: chest · 0.14mm/px · 2 of 2 slices shown]
[im 1/2]
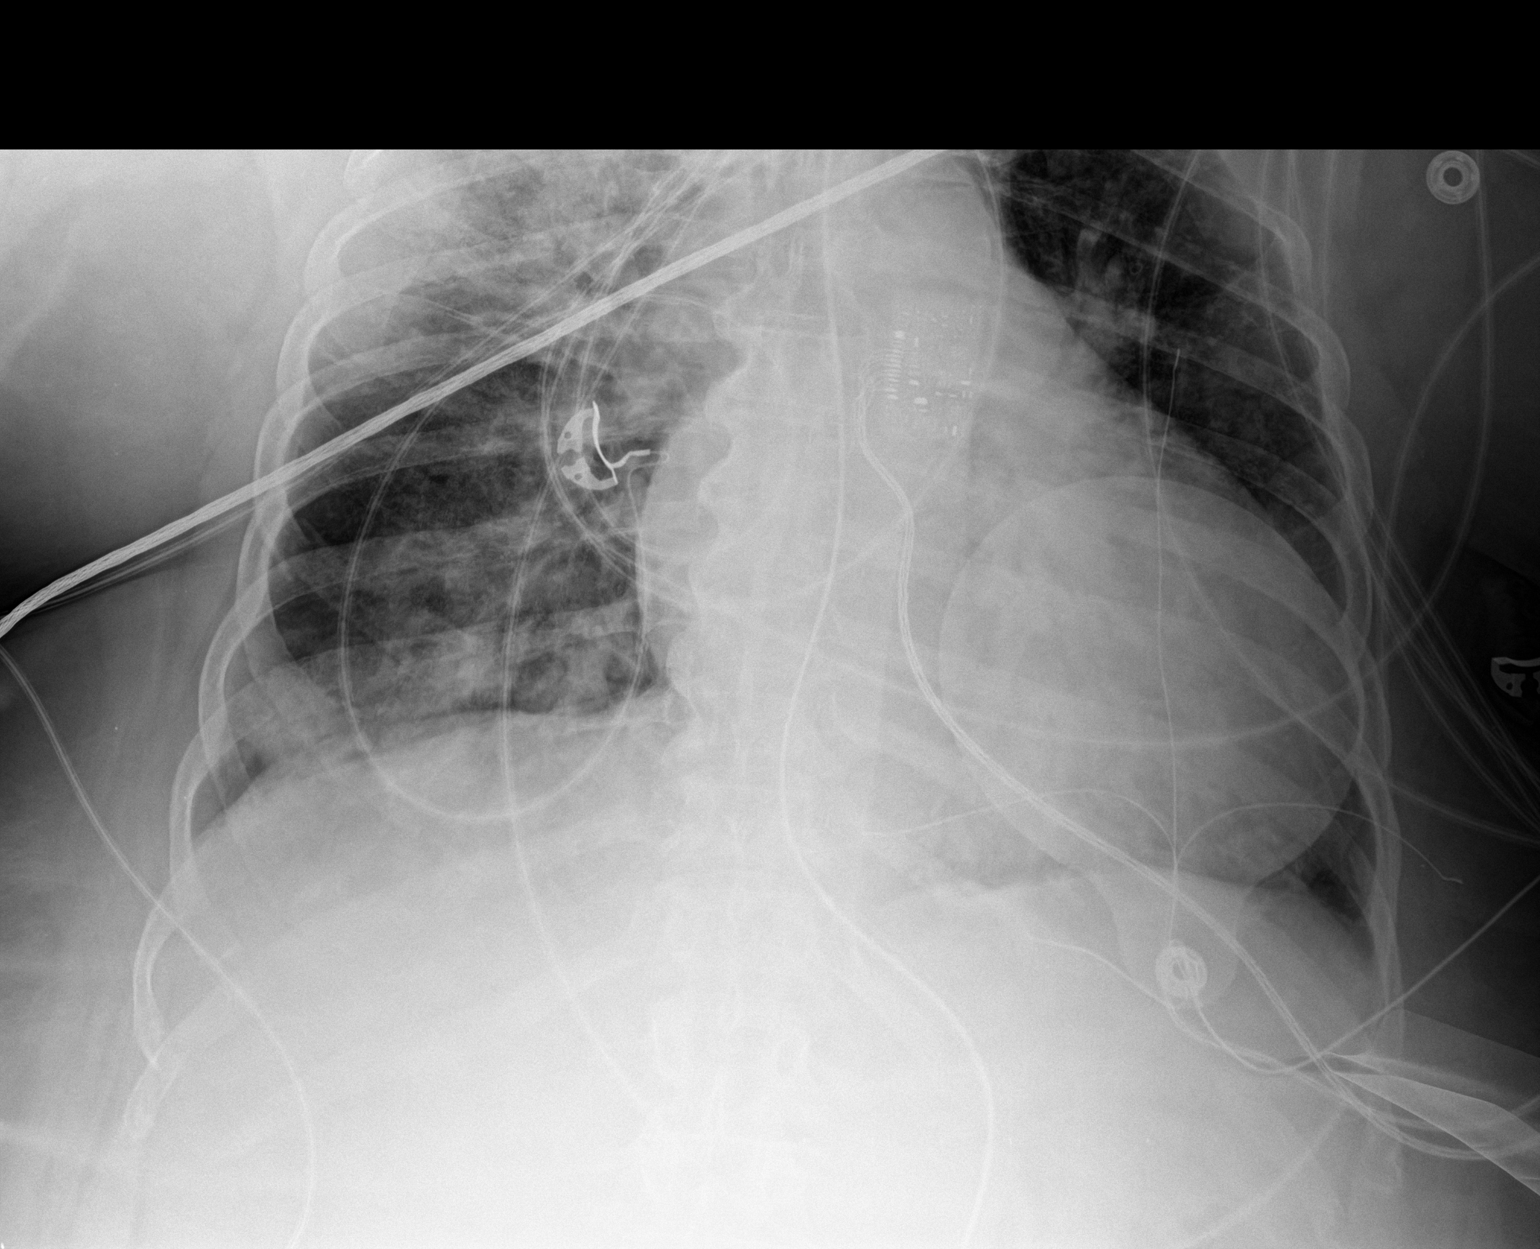
[im 2/2]
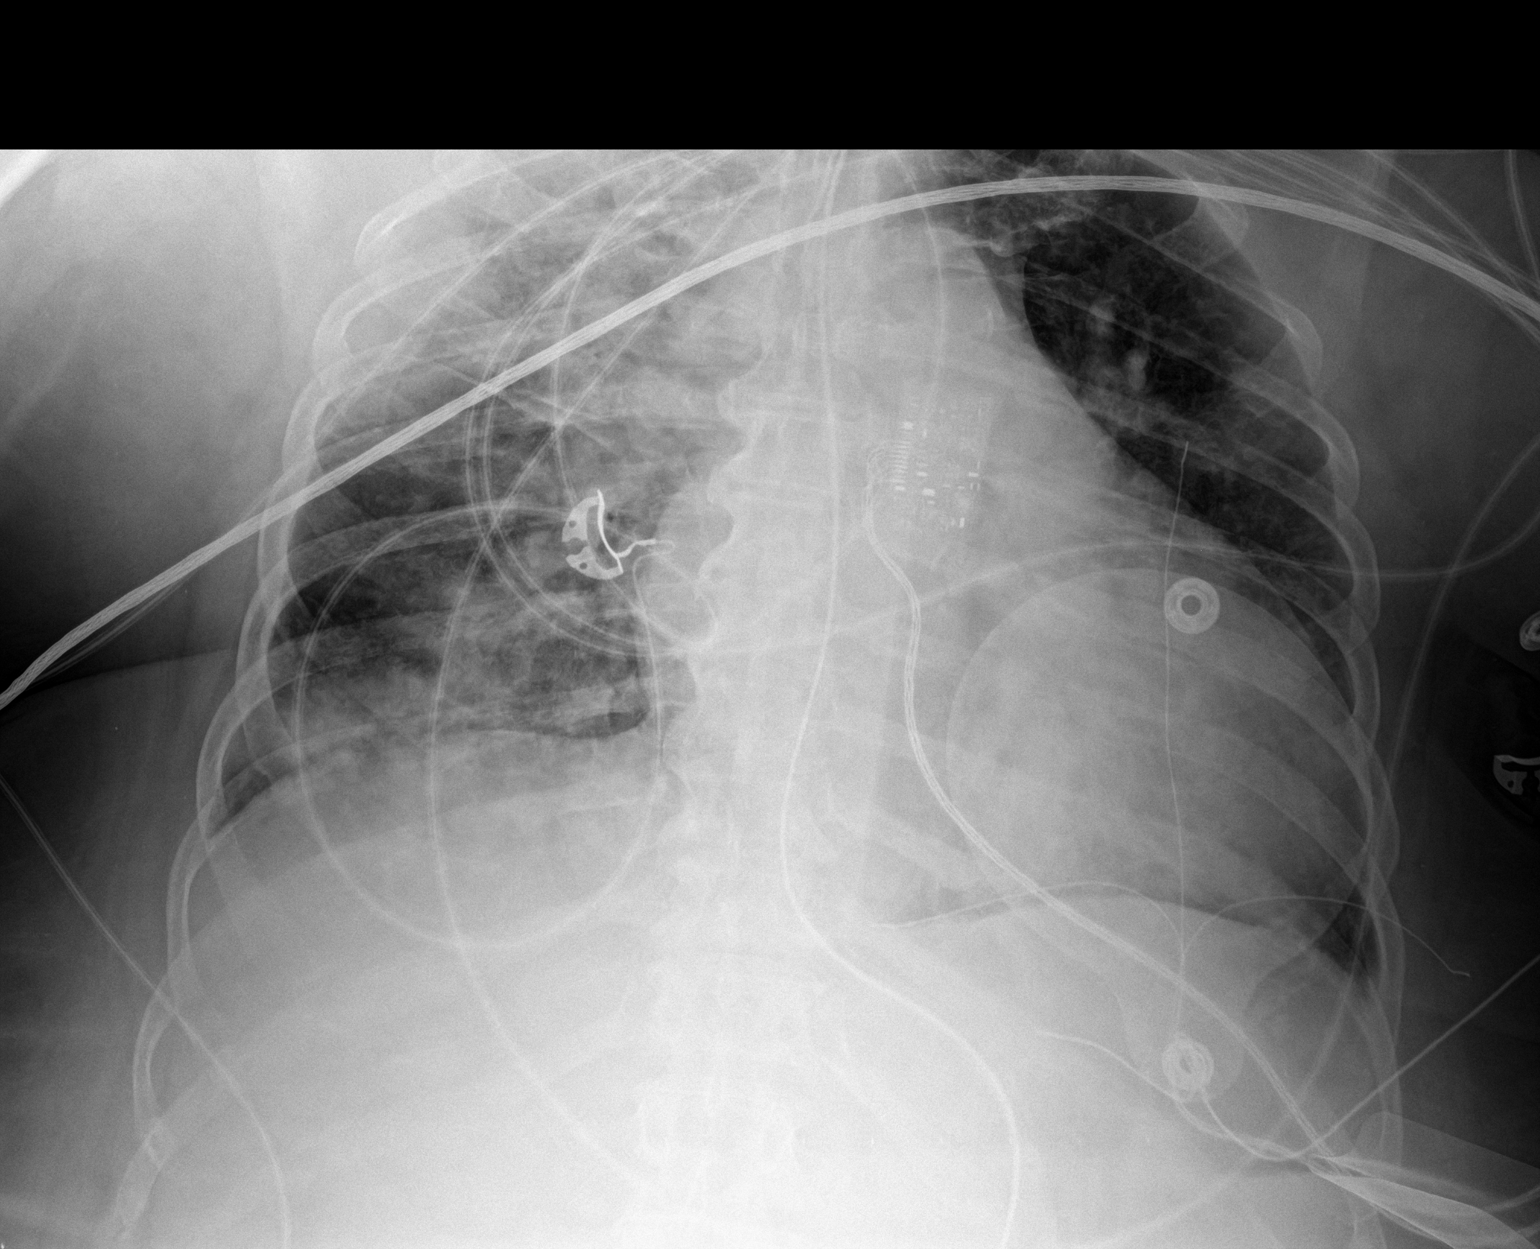

[2 of 2 positions shown; findings below may reference images not displayed]

FINDINGS: *Endotracheal tube in the mid trachea, 4.2 cm from the carina.
*Transesophageal tube tip and side port below the GE junction,
beyond the level of imaging.
*Multiple telemetry leads and monitoring devices overlie the chest.
*Defibrillator pad over the left chest wall.

Patchy opacities are present throughout both lungs more focally in
the right upper lobe and infrahilar lung. No visible pneumothorax or
effusion. Cardiac silhouette is borderline enlarged though may be
accentuated by portable supine technique. The aorta is calcified.
The remaining cardiomediastinal contours are unremarkable. No acute
osseous or soft tissue abnormality.
IMPRESSION: Satisfactory positioning of lines and tubes.

Bilateral airspace opacities, right greater than left, could reflect
edema, pneumonia or ARDS.
